# Patient Record
Sex: Male | Born: 1955
Health system: Southern US, Community
[De-identification: ages and names within clinical notes are randomized; demographics above are authoritative.]

## PROBLEM LIST (undated history)

## (undated) DIAGNOSIS — D518 Other vitamin B12 deficiency anemias: Secondary | ICD-10-CM

## (undated) DIAGNOSIS — E559 Vitamin D deficiency, unspecified: Secondary | ICD-10-CM

## (undated) DIAGNOSIS — I77811 Abdominal aortic ectasia: Secondary | ICD-10-CM

## (undated) DIAGNOSIS — E119 Type 2 diabetes mellitus without complications: Secondary | ICD-10-CM

## (undated) DIAGNOSIS — E291 Testicular hypofunction: Secondary | ICD-10-CM

## (undated) DIAGNOSIS — E1165 Type 2 diabetes mellitus with hyperglycemia: Secondary | ICD-10-CM

## (undated) DIAGNOSIS — I739 Peripheral vascular disease, unspecified: Secondary | ICD-10-CM

## (undated) DIAGNOSIS — I6529 Occlusion and stenosis of unspecified carotid artery: Secondary | ICD-10-CM

## (undated) DIAGNOSIS — E039 Hypothyroidism, unspecified: Secondary | ICD-10-CM

## (undated) DIAGNOSIS — F339 Major depressive disorder, recurrent, unspecified: Secondary | ICD-10-CM

## (undated) DIAGNOSIS — J302 Other seasonal allergic rhinitis: Secondary | ICD-10-CM

## (undated) DIAGNOSIS — E785 Hyperlipidemia, unspecified: Secondary | ICD-10-CM

## (undated) DIAGNOSIS — M1990 Unspecified osteoarthritis, unspecified site: Secondary | ICD-10-CM

## (undated) DIAGNOSIS — I6350 Cerebral infarction due to unspecified occlusion or stenosis of unspecified cerebral artery: Secondary | ICD-10-CM

## (undated) DIAGNOSIS — E042 Nontoxic multinodular goiter: Secondary | ICD-10-CM

## (undated) DIAGNOSIS — K5900 Constipation, unspecified: Secondary | ICD-10-CM

## (undated) DIAGNOSIS — I700 Atherosclerosis of aorta: Secondary | ICD-10-CM

## (undated) DIAGNOSIS — K219 Gastro-esophageal reflux disease without esophagitis: Secondary | ICD-10-CM

## (undated) DIAGNOSIS — L819 Disorder of pigmentation, unspecified: Secondary | ICD-10-CM

## (undated) DIAGNOSIS — D649 Anemia, unspecified: Secondary | ICD-10-CM

## (undated) DIAGNOSIS — I10 Essential (primary) hypertension: Secondary | ICD-10-CM

## (undated) DIAGNOSIS — I719 Aortic aneurysm of unspecified site, without rupture: Secondary | ICD-10-CM

## (undated) DIAGNOSIS — Z8719 Personal history of other diseases of the digestive system: Secondary | ICD-10-CM

## (undated) DIAGNOSIS — F32A Depression, unspecified: Secondary | ICD-10-CM

## (undated) DIAGNOSIS — Z8711 Personal history of peptic ulcer disease: Secondary | ICD-10-CM

## (undated) DIAGNOSIS — E790 Hyperuricemia without signs of inflammatory arthritis and tophaceous disease: Secondary | ICD-10-CM

## (undated) HISTORY — DX: Abdominal aortic ectasia: I77.811

## (undated) HISTORY — DX: Aortic aneurysm of unspecified site, without rupture: I71.9

## (undated) HISTORY — DX: Constipation, unspecified: K59.00

## (undated) HISTORY — DX: Hypothyroidism, unspecified: E03.9

## (undated) HISTORY — DX: Essential (primary) hypertension: I10

## (undated) HISTORY — DX: Occlusion and stenosis of unspecified carotid artery: I65.29

## (undated) HISTORY — DX: Other vitamin B12 deficiency anemias: D51.8

## (undated) HISTORY — DX: Hyperlipidemia, unspecified: E78.5

## (undated) HISTORY — DX: Vitamin D deficiency, unspecified: E55.9

## (undated) HISTORY — DX: Nontoxic multinodular goiter: E04.2

## (undated) HISTORY — DX: Peripheral vascular disease, unspecified: I73.9

## (undated) HISTORY — DX: Anemia, unspecified: D64.9

## (undated) HISTORY — DX: Depression, unspecified: F32.A

## (undated) HISTORY — DX: Gastro-esophageal reflux disease without esophagitis: K21.9

## (undated) HISTORY — PX: CATARACT EXTRACTION W/ INTRAOCULAR LENS IMPLANT: SHX1309

## (undated) HISTORY — DX: Cerebral infarction due to unspecified occlusion or stenosis of unspecified cerebral artery: I63.50

## (undated) HISTORY — DX: Type 2 diabetes mellitus with hyperglycemia: E11.65

## (undated) HISTORY — DX: Major depressive disorder, recurrent, unspecified: F33.9

## (undated) HISTORY — DX: Atherosclerosis of aorta: I70.0

## (undated) HISTORY — DX: Testicular hypofunction: E29.1

---

## 1998-12-24 ENCOUNTER — Encounter: Payer: Self-pay | Admitting: Gastroenterology

## 1999-02-09 ENCOUNTER — Encounter: Payer: Self-pay | Admitting: Gastroenterology

## 1999-02-11 ENCOUNTER — Encounter: Payer: Self-pay | Admitting: Gastroenterology

## 1999-02-11 ENCOUNTER — Ambulatory Visit (HOSPITAL_COMMUNITY): Admission: RE | Admit: 1999-02-11 | Discharge: 1999-02-11 | Payer: Self-pay | Admitting: Gastroenterology

## 1999-03-23 ENCOUNTER — Encounter (INDEPENDENT_AMBULATORY_CARE_PROVIDER_SITE_OTHER): Payer: Self-pay | Admitting: *Deleted

## 1999-03-23 ENCOUNTER — Ambulatory Visit (HOSPITAL_COMMUNITY): Admission: RE | Admit: 1999-03-23 | Discharge: 1999-03-23 | Payer: Self-pay | Admitting: Gastroenterology

## 2000-10-18 ENCOUNTER — Encounter: Admission: RE | Admit: 2000-10-18 | Discharge: 2000-10-18 | Payer: Self-pay | Admitting: Orthopedic Surgery

## 2008-02-28 ENCOUNTER — Ambulatory Visit: Payer: Self-pay | Admitting: Internal Medicine

## 2008-02-28 DIAGNOSIS — E7800 Pure hypercholesterolemia, unspecified: Secondary | ICD-10-CM | POA: Insufficient documentation

## 2008-02-28 DIAGNOSIS — I10 Essential (primary) hypertension: Secondary | ICD-10-CM | POA: Insufficient documentation

## 2008-02-28 DIAGNOSIS — J309 Allergic rhinitis, unspecified: Secondary | ICD-10-CM | POA: Insufficient documentation

## 2008-02-28 DIAGNOSIS — J3089 Other allergic rhinitis: Secondary | ICD-10-CM | POA: Insufficient documentation

## 2008-02-28 DIAGNOSIS — E739 Lactose intolerance, unspecified: Secondary | ICD-10-CM | POA: Insufficient documentation

## 2008-02-28 DIAGNOSIS — K219 Gastro-esophageal reflux disease without esophagitis: Secondary | ICD-10-CM | POA: Insufficient documentation

## 2008-02-28 LAB — CONVERTED CEMR LAB
ALT: 25 units/L (ref 0–53)
AST: 36 units/L (ref 0–37)
Albumin: 4.1 g/dL (ref 3.5–5.2)
Alkaline Phosphatase: 60 units/L (ref 39–117)
BUN: 11 mg/dL (ref 6–23)
Basophils Absolute: 0 10*3/uL (ref 0.0–0.1)
Basophils Relative: 0.4 % (ref 0.0–3.0)
Bilirubin, Direct: 0.1 mg/dL (ref 0.0–0.3)
CO2: 31 meq/L (ref 19–32)
Calcium: 10 mg/dL (ref 8.4–10.5)
Chloride: 104 meq/L (ref 96–112)
Cholesterol: 150 mg/dL (ref 0–200)
Creatinine, Ser: 0.9 mg/dL (ref 0.4–1.5)
Eosinophils Absolute: 0.1 10*3/uL (ref 0.0–0.7)
Eosinophils Relative: 2.3 % (ref 0.0–5.0)
GFR calc Af Amer: 114 mL/min
GFR calc non Af Amer: 94 mL/min
Glucose, Bld: 103 mg/dL — ABNORMAL HIGH (ref 70–99)
HCT: 43.7 % (ref 39.0–52.0)
HDL: 48.6 mg/dL (ref >39.0)
Hemoglobin: 14.6 g/dL (ref 13.0–17.0)
Hgb A1c MFr Bld: 5.8 % (ref 4.6–6.0)
Lymphocytes Relative: 28.1 % (ref 12.0–46.0)
MCHC: 33.5 g/dL (ref 30.0–36.0)
MCV: 81.5 fL (ref 78.0–100.0)
Monocytes Absolute: 0.4 10*3/uL (ref 0.1–1.0)
Monocytes Relative: 7.3 % (ref 3.0–12.0)
Neutro Abs: 3.8 10*3/uL (ref 1.4–7.7)
Neutrophils Relative %: 61.9 % (ref 43.0–77.0)
PSA: 0.25 ng/mL (ref 0.10–4.00)
Platelets: 277 10*3/uL (ref 150–400)
Potassium: 4 meq/L (ref 3.5–5.1)
RBC: 5.36 M/uL (ref 4.22–5.81)
RDW: 13.5 % (ref 11.5–14.6)
Sodium: 144 meq/L (ref 135–145)
TSH: 1.52 microintl units/mL (ref 0.35–5.50)
Total Bilirubin: 1 mg/dL (ref 0.3–1.2)
Total Protein: 7.1 g/dL (ref 6.0–8.3)
WBC: 6 10*3/uL (ref 4.5–10.5)

## 2008-05-01 ENCOUNTER — Ambulatory Visit: Payer: Self-pay | Admitting: Gastroenterology

## 2008-05-12 HISTORY — PX: ESOPHAGOGASTRODUODENOSCOPY: SHX1529

## 2008-05-12 HISTORY — PX: COLONOSCOPY: SHX174

## 2008-05-13 ENCOUNTER — Ambulatory Visit: Payer: Self-pay | Admitting: Gastroenterology

## 2008-05-13 LAB — CONVERTED CEMR LAB: UREASE: NEGATIVE

## 2008-05-15 ENCOUNTER — Encounter: Payer: Self-pay | Admitting: Gastroenterology

## 2008-07-01 ENCOUNTER — Ambulatory Visit: Payer: Self-pay | Admitting: Internal Medicine

## 2009-10-29 ENCOUNTER — Telehealth: Payer: Self-pay | Admitting: Gastroenterology

## 2009-11-05 ENCOUNTER — Ambulatory Visit: Payer: Self-pay | Admitting: Gastroenterology

## 2009-11-05 DIAGNOSIS — R142 Eructation: Secondary | ICD-10-CM

## 2009-11-05 DIAGNOSIS — R143 Flatulence: Secondary | ICD-10-CM

## 2009-11-05 DIAGNOSIS — R197 Diarrhea, unspecified: Secondary | ICD-10-CM | POA: Insufficient documentation

## 2009-11-05 DIAGNOSIS — R141 Gas pain: Secondary | ICD-10-CM | POA: Insufficient documentation

## 2009-11-06 LAB — CONVERTED CEMR LAB
IgA: 226 mg/dL (ref 68–378)
TSH: 1.15 microintl units/mL (ref 0.35–5.50)
Tissue Transglutaminase Ab, IgA: 15.6 units (ref <20)

## 2009-11-10 ENCOUNTER — Telehealth: Payer: Self-pay | Admitting: Gastroenterology

## 2009-11-18 ENCOUNTER — Ambulatory Visit (HOSPITAL_COMMUNITY): Admission: RE | Admit: 2009-11-18 | Discharge: 2009-11-18 | Payer: Self-pay | Admitting: Gastroenterology

## 2009-12-04 ENCOUNTER — Telehealth: Payer: Self-pay | Admitting: Gastroenterology

## 2010-05-01 ENCOUNTER — Encounter: Payer: Self-pay | Admitting: Orthopedic Surgery

## 2010-05-02 ENCOUNTER — Encounter: Payer: Self-pay | Admitting: Gastroenterology

## 2010-05-02 ENCOUNTER — Encounter: Payer: Self-pay | Admitting: Unknown Physician Specialty

## 2010-05-11 NOTE — Progress Notes (Signed)
Summary: Triage  Phone Note Call from Patient Call back at Home Phone [phone]   Caller: Patient Call For: Dr. Russella Dar Reason for Call: Talk to Nurse Summary of Call: having abd. pain, bloating and gas Initial call taken by: Karna Christmas,  October 29, 2009 3:44 PM  Follow-up for Phone Call        patient scheduled to see Dr Russella Dar 7/28/111, continued GI problems and requests an appointment with Dr Russella Dar. Follow-up by: Darcey Nora RN, CGRN,  October 29, 2009 3:56 PM

## 2010-05-11 NOTE — Progress Notes (Signed)
Summary: resch   Phone Note Call from Patient Call back at Work Phone [phone]   Caller: Patient Call For: Dr. Russella Dar Reason for Call: Talk to Nurse Summary of Call: would like to resch [place] Initial call taken by: Vallarie Mare,  November 10, 2009 12:58 PM  Follow-up for Phone Call        patient is provided the number to [facility] radiology to reschedule his appointment  Follow-up by: Darcey Nora RN, CGRN,  November 10, 2009 1:49 PM

## 2010-05-11 NOTE — Progress Notes (Signed)
Summary: Triage  Phone Note Call from Patient Call back at Work Phone [phone]   Caller: Patient Call For: Dr Russella Dar Reason for Call: Talk to Nurse Details for Reason: Triage Summary of Call: Pt c/o continued bloating. States he is "uncomfortable" Initial call taken by: Dwan Bolt,  December 04, 2009 12:14 PM  Follow-up for Phone Call        Patient  says the xifaxan he was treated didn't help his gas and bloating. Patient  states he hasn't changed his diet or tried gas-x as recommended by Dr Russella Dar in the office visit 11/05/09.  Reviewed that may be dietary and will mail him a gas prevention diet.  He is also asked to try gas-x three times a day ac meals.  he is asked to call back if he makes dietary changes and sees no improvement. Follow-up by: Darcey Nora RN, CGRN,  December 04, 2009 1:45 PM

## 2010-05-11 NOTE — Assessment & Plan Note (Signed)
Summary: gas/bloating/abdominal pain/sheri   History of Present Illness Visit Type: Follow-up Visit Primary GI MD: Elie Goody MD [facility] Primary Provider: Gaylyn Rong, MD Requesting Provider: Marzella Schlein Chief Complaint: gas and bloating after eating with loose stools few hours after x 3-4 months History of Present Illness:   Mr. Tagliaferro complains of 3-4 months of gas, bloating, fluctuating abdominal distention and loose stools. His symptoms predominantly occur after meals. He underwent upper endoscopy and colonoscopy in February 2010. Colonoscopy was normal. He notes no changes in diet, and no recent antibiotic use. He traveled to [place] several months ago.   GI Review of Systems    Reports belching and  bloating.      Denies abdominal pain, acid reflux, chest pain, dysphagia with liquids, dysphagia with solids, heartburn, loss of appetite, nausea, vomiting, vomiting blood, and  weight loss.      Reports change in bowel habits and  diarrhea.     Denies anal fissure, black tarry stools, constipation, diverticulosis, fecal incontinence, heme positive stool, hemorrhoids, irritable bowel syndrome, jaundice, light color stool, liver problems, rectal bleeding, and  rectal pain.   Current Medications (verified): 1)  Metoprolol Succinate 50 Mg Xr24h-Tab (Metoprolol Succinate) .Marland Kitchen.. 1 Once Daily 2)  Nexium 40 Mg Cpdr (Esomeprazole Magnesium) .Marland Kitchen.. 1 Once Daily 3)  Simvastatin 20 Mg Tabs (Simvastatin) .Marland Kitchen.. 1 Once Daily 4)  Advair Diskus 500-50 Mcg/dose Aepb (Fluticasone-Salmeterol) .... Use As Directed As Needed  Allergies (verified): No Known Drug Allergies  Past History:  Past Medical History: Reviewed history from 05/01/2008 and no changes required. Allergic rhinitis GERD Gastric ulcer, 2004 in [place] Hyperlipidemia Hypertension impaired glucose tolerance  Past Surgical History:  esophagogastroduodenoscopy February 2010 colonoscopy February 2010  Family  History: Reviewed history from 05/01/2008 and no changes required. father died age 55, MI mother age 83.  History of hypertension, cholelithiasis    One brother two sisters in good health No FH of Colon Cancer:  Social History: Reviewed history from 05/01/2008 and no changes required. Married two children nonsmoker-former smoker born in [place]; resident of [place] 27 years descries moderately heavy alcohol use, but has tapered Daily Caffeine Use-10 cups daily Patient gets regular exercise.  Review of Systems       The patient complains of allergy/sinus.         The pertinent positives and negatives are noted as above and in the HPI. All other ROS were reviewed and were negative.   Vital Signs:  Patient profile:   55 year old male Height:      66 inches Weight:      160 pounds BMI:     25.92 Pulse rate:   100 / minute Pulse rhythm:   regular BP sitting:   118 / 90  (left arm)  Vitals Entered By: Milford Cage NCMA (November 05, 2009 10:26 AM)  Physical Exam  General:  Well developed, well nourished, no acute distress. Head:  Normocephalic and atraumatic. Eyes:  PERRLA, no icterus. Mouth:  No deformity or lesions, dentition normal. Lungs:  Clear throughout to auscultation. Heart:  Regular rate and rhythm; no murmurs, rubs,  or bruits. Abdomen:  Soft, nontender and nondistended. No masses, hepatosplenomegaly or hernias noted. Normal bowel sounds. Neurologic:  Alert and  oriented x4;  grossly normal neurologically. Psych:  Alert and cooperative. anxious.    Impression & Recommendations:  Problem # 1:  ABDOMINAL DISTENSION (ICD-787.3) This is likely secondary to gas and bloating. Rule out mass lesions, and ascites, although much less  likely than gas and bloating.  Orders: Ultrasound Abdomen (UAS)  Problem # 2:  DIARRHEA (ICD-787.91) Symptoms typical for irritable bowel syndrome. Rule out chronic intestinal infections. Trial of Xifaxan as below. Consider a trial of  metronidazole studies negative and the symptoms do not respond to Xifaxan. Orders: TLB-TSH (Thyroid Stimulating Hormone) (84443-TSH) TLB-IgA (Immunoglobulin A) (82784-IGA) T-Sprue Panel (Celiac Disease Aby Eval) (83516x3/[address]) T-Culture, Stool (87045/[id number]) T-Culture, C-Diff Toxin A/B (91478-29562) T-Stool for Fat, Quantitative [id number]) T-Stool Fats [place] Stain [id number]) T-Stool Giardia / Crypto- EIA (24401) T-Stool for O&P (02725-36644) T-Fecal WBC (03474-25956)  Problem # 3:  FLATULENCE ERUCTATION AND GAS PAIN (ICD-787.3) Low gas diet. Trial of Xifaxan 500 mg t.i.d. for 10 days.  Patient Instructions: 1)  Get your labs drawn today in the basement.  2)  You have been scheduled for a abdominal ultrasound.  3)  Excessive Gas Diet handout given.  4)  Use Gas-X three times a day as needed. 5)  Pick up your prescription from your pharmacy.  6)  Please schedule a follow-up appointment in 6  weeks.  7)  Copy sent to : Marzella Schlein 8)  The medication list was reviewed and reconciled.  All changed / newly prescribed medications were explained.  A complete medication list was provided to the patient / caregiver.  Prescriptions: XIFAXAN 200 MG TABS (RIFAXIMIN) 2  tablet by mouth three times a day  #60 x 0   Entered by:   Christie Nottingham CMA (AAMA)   Authorized by:   Meryl Dare MD [facility]   Signed by:   Meryl Dare MD [facility] on 11/05/2009   Method used:   Electronically to        [facility] [address] (retail)       [phone]       [place], [place]  [address]       Ph: [phone]       Fax: [phone]   RxID:   [id number]

## 2010-08-27 NOTE — Procedures (Signed)
[facility]  Patient:    Justin [name]                            MRN: [id number] Proc. Date: 03/23/99 Adm. Date:  [id number] Attending:  Charna Elizabeth CC:         Anna Genre. Little, M.D.                           Procedure Report  DATE OF BIRTH:  June 10, 1935  REFERRING PHYSICIAN:  Anna Genre. Little, M.D.  PROCEDURES PERFORMED: Colonoscopy with biopsy times one.  ENDOSCOPIST:  Anselmo Rod, M.D.  INSTRUMENT USED: Olympus video colonoscope.  INDICATION FOR PROCEDURE:  Guaiac positive stool in a 55 year old [place] male, ule out polyps, AVMs, masses, hemorrhoids, etc.  PREPROCEDURE PREPARATION:  Informed consent was procured from the patient.  The  patient was fasted for eight hours prior to the procedure and prepped with a bottle of magnesium citrate and a gallon of NuLytely the night prior to the procedure.  PREPROCEDURE PHYSICAL: The patient had stable vital signs.  NECK:  Supple.  CHEST:  Clear to auscultation.  S1 and S2 regular.  ABDOMEN:  Soft with normal abdominal bowel sounds.  DESCRIPTION OF PROCEDURE:  The patient was placed in the left lateral decubitus  position and sedated with 50 mg of Demerol and 4 mg of Versed intravenously. Once the patient was adequately sedated and maintained on low flow oxygen and continuous cardiac monitoring, the Olympus video colonoscope was advanced from the rectum o the cecum without difficulty.  The procedure was completed up to the cecum. The patient had one small diminutive polyp at 15 cm that was biopsied by regular biopsy forceps.  No other abnormalities were seen.  The patient tolerated the procedure well without complications.  IMPRESSION:  One small polyp removed by regular biopsy forceps at 15 cm. Otherwise normal colonoscopy.  RECOMMENDATIONS: The patient has been advised to follow up in the office for repeat guaiac testing.  Further recommendations will be made  thereafter. DD:  03/23/99 TD:  03/24/99 Job: [id number]

## 2010-09-20 ENCOUNTER — Encounter: Payer: Self-pay | Admitting: Internal Medicine

## 2010-09-24 ENCOUNTER — Ambulatory Visit: Payer: Self-pay | Admitting: Internal Medicine

## 2010-09-26 NOTE — Progress Notes (Signed)
  Subjective:    Patient ID: Justin [name], male    DOB: 16-Apr-1955, 55 y.o.   MRN: [id number]  HPI No show   Review of Systems     Objective:   Physical Exam        Assessment & Plan:

## 2010-10-05 ENCOUNTER — Other Ambulatory Visit: Payer: Self-pay | Admitting: Allergy

## 2010-10-05 ENCOUNTER — Ambulatory Visit
Admission: RE | Admit: 2010-10-05 | Discharge: 2010-10-05 | Disposition: A | Discharge status: Home / Self Care | Payer: BC Managed Care – PPO | Source: Ambulatory Visit | Attending: Allergy | Admitting: Allergy

## 2010-10-05 DIAGNOSIS — J329 Chronic sinusitis, unspecified: Secondary | ICD-10-CM

## 2010-10-05 DIAGNOSIS — J45901 Unspecified asthma with (acute) exacerbation: Secondary | ICD-10-CM

## 2010-10-08 ENCOUNTER — Other Ambulatory Visit: Payer: BC Managed Care – PPO

## 2011-06-21 ENCOUNTER — Other Ambulatory Visit (HOSPITAL_COMMUNITY): Payer: Self-pay | Admitting: Cardiology

## 2011-06-21 DIAGNOSIS — I771 Stricture of artery: Secondary | ICD-10-CM

## 2011-06-27 ENCOUNTER — Encounter (HOSPITAL_COMMUNITY)
Admission: RE | Admit: 2011-06-27 | Discharge: 2011-06-27 | Disposition: A | Discharge status: Home / Self Care | Payer: BC Managed Care – PPO | Source: Ambulatory Visit | Attending: Cardiology | Admitting: Cardiology

## 2011-06-27 DIAGNOSIS — R079 Chest pain, unspecified: Secondary | ICD-10-CM | POA: Insufficient documentation

## 2011-06-27 DIAGNOSIS — K219 Gastro-esophageal reflux disease without esophagitis: Secondary | ICD-10-CM | POA: Insufficient documentation

## 2011-06-27 DIAGNOSIS — I10 Essential (primary) hypertension: Secondary | ICD-10-CM | POA: Insufficient documentation

## 2011-06-27 DIAGNOSIS — R0602 Shortness of breath: Secondary | ICD-10-CM | POA: Insufficient documentation

## 2011-06-27 MED ORDER — REGADENOSON 0.4 MG/5ML IV SOLN
INTRAVENOUS | Status: AC
  Filled 2011-06-27: qty 5

## 2011-06-27 MED ORDER — REGADENOSON 0.4 MG/5ML IV SOLN
0.4000 mg | Freq: Once | INTRAVENOUS | Status: AC
  Administered 2011-06-27: 0.4 mg via INTRAVENOUS

## 2011-06-27 MED ORDER — TECHNETIUM TC 99M TETROFOSMIN IV KIT
10.0000 | PACK | Freq: Once | INTRAVENOUS | Status: AC | PRN
Start: 2011-06-27 — End: 2011-06-27
  Administered 2011-06-27: 10 via INTRAVENOUS

## 2011-06-27 MED ORDER — TECHNETIUM TC 99M TETROFOSMIN IV KIT
30.0000 | PACK | Freq: Once | INTRAVENOUS | Status: AC | PRN
  Administered 2011-06-27: 30 via INTRAVENOUS

## 2011-06-28 ENCOUNTER — Ambulatory Visit (HOSPITAL_COMMUNITY)
Admission: RE | Admit: 2011-06-28 | Discharge: 2011-06-28 | Disposition: A | Discharge status: Home / Self Care | Payer: BC Managed Care – PPO | Source: Ambulatory Visit | Attending: Cardiology | Admitting: Cardiology

## 2011-06-28 DIAGNOSIS — I771 Stricture of artery: Secondary | ICD-10-CM

## 2011-06-29 ENCOUNTER — Telehealth (HOSPITAL_COMMUNITY): Payer: Self-pay

## 2011-06-29 ENCOUNTER — Other Ambulatory Visit (HOSPITAL_COMMUNITY): Payer: Self-pay | Admitting: Interventional Radiology

## 2011-06-29 DIAGNOSIS — I771 Stricture of artery: Secondary | ICD-10-CM

## 2011-06-29 NOTE — Telephone Encounter (Signed)
Tried to contact pt to find out if he was going to proceed.  I was unable to get in touch with him.. He had stated he would call first thing this morning.

## 2011-07-01 ENCOUNTER — Other Ambulatory Visit: Payer: Self-pay | Admitting: Physician Assistant

## 2011-07-04 ENCOUNTER — Other Ambulatory Visit (HOSPITAL_COMMUNITY): Payer: Self-pay | Admitting: Physician Assistant

## 2011-07-04 ENCOUNTER — Encounter (HOSPITAL_COMMUNITY): Payer: Self-pay | Admitting: Pharmacy Technician

## 2011-07-05 ENCOUNTER — Other Ambulatory Visit: Payer: Self-pay | Admitting: Radiology

## 2011-07-05 ENCOUNTER — Ambulatory Visit (HOSPITAL_COMMUNITY)
Admission: RE | Admit: 2011-07-05 | Discharge: 2011-07-05 | Disposition: A | Discharge status: Home / Self Care | Payer: BC Managed Care – PPO | Source: Ambulatory Visit | Attending: Interventional Radiology | Admitting: Interventional Radiology

## 2011-07-05 ENCOUNTER — Encounter (HOSPITAL_COMMUNITY): Payer: Self-pay

## 2011-07-05 ENCOUNTER — Other Ambulatory Visit (HOSPITAL_COMMUNITY): Payer: Self-pay | Admitting: Interventional Radiology

## 2011-07-05 DIAGNOSIS — K219 Gastro-esophageal reflux disease without esophagitis: Secondary | ICD-10-CM | POA: Insufficient documentation

## 2011-07-05 DIAGNOSIS — E785 Hyperlipidemia, unspecified: Secondary | ICD-10-CM | POA: Insufficient documentation

## 2011-07-05 DIAGNOSIS — I771 Stricture of artery: Secondary | ICD-10-CM

## 2011-07-05 DIAGNOSIS — I6529 Occlusion and stenosis of unspecified carotid artery: Secondary | ICD-10-CM | POA: Insufficient documentation

## 2011-07-05 DIAGNOSIS — I10 Essential (primary) hypertension: Secondary | ICD-10-CM | POA: Insufficient documentation

## 2011-07-05 LAB — BASIC METABOLIC PANEL
BUN: 12 mg/dL (ref 6–23)
CO2: 24 mEq/L (ref 19–32)
Chloride: 106 mEq/L (ref 96–112)
Creatinine, Ser: 0.83 mg/dL (ref 0.50–1.35)
Glucose, Bld: 119 mg/dL — ABNORMAL HIGH (ref 70–99)

## 2011-07-05 LAB — CBC
HCT: 47.7 % (ref 39.0–52.0)
Hemoglobin: 16.2 g/dL (ref 13.0–17.0)
MCH: 27.3 pg (ref 26.0–34.0)
MCHC: 34 g/dL (ref 30.0–36.0)
MCV: 80.3 fL (ref 78.0–100.0)

## 2011-07-05 LAB — PROTIME-INR: INR: 1.07 (ref 0.00–1.49)

## 2011-07-05 MED ORDER — HEPARIN SOD (PORK) LOCK FLUSH 100 UNIT/ML IV SOLN
500.0000 [IU] | Freq: Once | INTRAVENOUS | Status: AC
  Administered 2011-07-05: 500 [IU] via INTRAVENOUS
  Filled 2011-07-05: qty 5

## 2011-07-05 MED ORDER — IOHEXOL 300 MG/ML  SOLN: 90.0000 mL | Freq: Once | INTRAMUSCULAR | Status: AC | PRN

## 2011-07-05 MED ORDER — FENTANYL CITRATE 0.05 MG/ML IJ SOLN
INTRAMUSCULAR | Status: AC | PRN
  Administered 2011-07-05 (×2): 25 ug via INTRAVENOUS

## 2011-07-05 MED ORDER — HEPARIN SOD (PORK) LOCK FLUSH 100 UNIT/ML IV SOLN
500.0000 [IU] | Freq: Once | INTRAVENOUS | Status: AC
  Administered 2011-07-05: 500 [IU] via INTRAVENOUS

## 2011-07-05 MED ORDER — MIDAZOLAM HCL 2 MG/2ML IJ SOLN
INTRAMUSCULAR | Status: AC
  Filled 2011-07-05: qty 6

## 2011-07-05 MED ORDER — SODIUM CHLORIDE 0.9 % IV SOLN
INTRAVENOUS | Status: DC
  Administered 2011-07-05: 50 mL/h via INTRAVENOUS

## 2011-07-05 MED ORDER — SODIUM CHLORIDE 0.9 % IV SOLN: INTRAVENOUS | Status: AC

## 2011-07-05 MED ORDER — FENTANYL CITRATE 0.05 MG/ML IJ SOLN
INTRAMUSCULAR | Status: AC
  Filled 2011-07-05: qty 4

## 2011-07-05 MED ORDER — MIDAZOLAM HCL 5 MG/5ML IJ SOLN
INTRAMUSCULAR | Status: AC | PRN
  Administered 2011-07-05: 1 mg via INTRAVENOUS
  Administered 2011-07-05 (×2): 0.5 mg via INTRAVENOUS

## 2011-07-05 NOTE — H&P (Signed)
Justin [name] is an 56 y.o. male.   Chief Complaint: Right carotid bruit US shows stenosis; asymptomatic HPI: scheduled for cerebral arteriogram in IR  Past Medical History  Diagnosis Date  . Allergic rhinitis   . GERD (gastroesophageal reflux disease)   . Gastric ulcer   . Hyperlipidemia   . HTN (hypertension)     Past Surgical History  Procedure Date  . Esophagogastroduodenoscopy 05-2008  . Colonoscopy 05-2008    Family History  Problem Relation Age of Onset  . Heart attack Father 49  . Hypertension Mother    Social History:  reports that he has quit smoking. He does not have any smokeless tobacco history on file. He reports that he drinks alcohol. His drug history not on file.  Allergies: No Known Allergies  Medications Prior to Admission  Medication Sig Dispense Refill  . cholecalciferol (VITAMIN D) 1000 UNITS tablet Take 1,000 Units by mouth daily.      Marland Kitchen esomeprazole (NEXIUM) 40 MG capsule Take 40 mg by mouth daily before breakfast.        . Fluticasone-Salmeterol (ADVAIR DISKUS) 500-50 MCG/DOSE AEPB Inhale 1 puff into the lungs every 12 (twelve) hours as needed. For shortness of breath      . metoprolol (TOPROL-XL) 50 MG 24 hr tablet Take 100 mg by mouth daily.       . simvastatin (ZOCOR) 20 MG tablet Take 40 mg by mouth at bedtime.       . vitamin B-12 (CYANOCOBALAMIN) 1000 MCG tablet Take 1,000 mcg by mouth daily.       Medications Prior to Admission  Medication Dose Route Frequency Provider Last Rate Last Dose  . 0.9 %  sodium chloride infusion   Intravenous Continuous Abundio Miu, MD        No results found for this or any previous visit (from the past 48 hour(s)). No results found.  Review of Systems  Constitutional: Negative for fever.  Gastrointestinal: Negative for nausea and vomiting.    Blood pressure 161/104, pulse 88, temperature 98.6 F (37 C), temperature source Oral, resp. rate 18, height 5\' 7"  (1.702 m), weight 150 lb (68.04 kg), SpO2  97.00%. Physical Exam  Constitutional: He is oriented to person, place, and time. He appears well-developed and well-nourished.  HENT:  Head: Normocephalic.  Eyes: EOM are normal.  Neck: Normal range of motion.  Cardiovascular: Normal rate, regular rhythm and normal heart sounds.   No murmur heard. Respiratory: Effort normal and breath sounds normal. He has no wheezes.  GI: Soft. Bowel sounds are normal. There is no tenderness.  Musculoskeletal: Normal range of motion.  Neurological: He is alert and oriented to person, place, and time.  Skin: Skin is warm and dry.     Assessment/Plan Rt carotid bruit; stenosis on [place] Scheduled for cerebral arteriogram in IR Pt and family aware of procedure benefits and risks and agreeable to proceed. Consent signed.  [name] A 07/05/2011, 8:42 AM

## 2011-07-05 NOTE — Procedures (Signed)
S/P 4 Vessel cerebral aretriogram. RT CFA approach  Findings  1. Approx !0 -20% stenosis of RT ICA proximally

## 2011-07-05 NOTE — Discharge Instructions (Signed)

## 2011-07-06 ENCOUNTER — Telehealth (HOSPITAL_COMMUNITY): Payer: Self-pay

## 2011-11-04 ENCOUNTER — Other Ambulatory Visit: Payer: Self-pay | Admitting: Otolaryngology

## 2011-11-04 ENCOUNTER — Ambulatory Visit
Admission: RE | Admit: 2011-11-04 | Discharge: 2011-11-04 | Disposition: A | Discharge status: Home / Self Care | Payer: BC Managed Care – PPO | Source: Ambulatory Visit | Attending: Otolaryngology | Admitting: Otolaryngology

## 2011-11-04 DIAGNOSIS — J3800 Paralysis of vocal cords and larynx, unspecified: Secondary | ICD-10-CM

## 2012-02-14 ENCOUNTER — Ambulatory Visit (HOSPITAL_BASED_OUTPATIENT_CLINIC_OR_DEPARTMENT_OTHER): Payer: BC Managed Care – PPO | Attending: Neurology

## 2012-02-14 VITALS — Ht 67.0 in | Wt 160.0 lb

## 2012-02-14 DIAGNOSIS — G4733 Obstructive sleep apnea (adult) (pediatric): Secondary | ICD-10-CM | POA: Insufficient documentation

## 2012-02-24 DIAGNOSIS — G4733 Obstructive sleep apnea (adult) (pediatric): Secondary | ICD-10-CM

## 2012-02-25 NOTE — Procedures (Signed)
NAMEBRILYN, Justin [name]                  ACCOUNT NO.:  [id number]  MEDICAL RECORD NO.:  [id number]          PATIENT TYPE:  OUT  LOCATION:  SLEEP CENTER                 FACILITY:  [facility]  PHYSICIAN:  Clinton D. Maple Hudson, MD, FCCP, FACPDATE OF BIRTH:  March 28, 1956  DATE OF STUDY:  02/14/2012                           NOCTURNAL POLYSOMNOGRAM  REFERRING PHYSICIAN:  MICHAEL DALE APPLEGATE  REFERRING PHYSICIAN:  Neysa Bonito, M.D.  INDICATION FOR STUDY:  Hypersomnia with sleep apnea.  EPWORTH SLEEPINESS SCORE:  17/24.  BMI 25.1, weight 160 pounds.  Height 67 inches, neck 17.5 inches.  MEDICATIONS:  Home medications are charted and reviewed.  SLEEP ARCHITECTURE:  Total sleep time 288.5 minutes with sleep efficiency 80.1%.  Stage I was 14.9%, stage II 81.6%, stage III absent, REM 3.5% of total sleep time.  Sleep latency 4 minutes, REM latency 192.5 minutes, awake after sleep onset 67.5 minutes.  Arousal index 70.1.  BEDTIME MEDICATION:  None.  RESPIRATORY DATA:  Apnea-hypopnea index (AHI) 77.4 per hour.  A total of 370 events was scored including 355 obstructive apneas, 2 mixed apneas, 13 hypopneas.  Events were not positional.  REM AHI 72 per hour.  This study was done as ordered, as a diagnostic NPSG protocol.  CPAP titration was not done.  OXYGEN DATA:  Loud snoring with oxygen desaturation to a nadir of 78% and mean oxygen saturation through the study of 94% on room air.  CARDIAC DATA:  Normal sinus rhythm.  MOVEMENT-PARASOMNIA:  No significant movement disturbance.  Bathroom x3.  IMPRESSIONS-RECOMMENDATIONS: 1. Severe obstructive sleep apnea/hypopnea syndrome, AHI 77.4 per hour     with non-positional events.  Loud snoring with oxygen desaturation     to a nadir of 78% and mean oxygen saturation through the study of     94% on room air. 2. This study was done as a diagnostic NPSG protocol as ordered.     Consider return for dedicated CPAP titration study or evaluate for  alternative management as clinically appropriate.     Clinton D. Maple Hudson, MD, Tonny Bollman, FACP Diplomate, American Board of Sleep Medicine    CDY/MEDQ  D:  02/25/2012 09:58:29  T:  02/25/2012 21:31:05  Job:  [id number]

## 2013-06-24 ENCOUNTER — Encounter (HOSPITAL_COMMUNITY): Payer: Self-pay | Admitting: Pharmacy Technician

## 2013-06-27 ENCOUNTER — Other Ambulatory Visit (HOSPITAL_COMMUNITY): Payer: Self-pay | Admitting: *Deleted

## 2013-06-27 ENCOUNTER — Ambulatory Visit (HOSPITAL_COMMUNITY)
Admission: RE | Admit: 2013-06-27 | Discharge: 2013-06-27 | Disposition: A | Discharge status: Home / Self Care | Payer: BC Managed Care – PPO | Source: Ambulatory Visit | Attending: Orthopedic Surgery | Admitting: Orthopedic Surgery

## 2013-06-27 ENCOUNTER — Encounter (HOSPITAL_COMMUNITY): Payer: Self-pay

## 2013-06-27 ENCOUNTER — Encounter (HOSPITAL_COMMUNITY)
Admission: RE | Admit: 2013-06-27 | Discharge: 2013-06-27 | Disposition: A | Discharge status: Home / Self Care | Payer: BC Managed Care – PPO | Source: Ambulatory Visit | Attending: Orthopedic Surgery | Admitting: Orthopedic Surgery

## 2013-06-27 DIAGNOSIS — E119 Type 2 diabetes mellitus without complications: Secondary | ICD-10-CM | POA: Insufficient documentation

## 2013-06-27 DIAGNOSIS — Z01812 Encounter for preprocedural laboratory examination: Secondary | ICD-10-CM | POA: Insufficient documentation

## 2013-06-27 DIAGNOSIS — Z01810 Encounter for preprocedural cardiovascular examination: Secondary | ICD-10-CM | POA: Insufficient documentation

## 2013-06-27 HISTORY — DX: Other seasonal allergic rhinitis: J30.2

## 2013-06-27 HISTORY — DX: Hyperuricemia without signs of inflammatory arthritis and tophaceous disease: E79.0

## 2013-06-27 HISTORY — DX: Unspecified osteoarthritis, unspecified site: M19.90

## 2013-06-27 HISTORY — DX: Type 2 diabetes mellitus without complications: E11.9

## 2013-06-27 HISTORY — DX: Disorder of pigmentation, unspecified: L81.9

## 2013-06-27 HISTORY — DX: Personal history of peptic ulcer disease: Z87.11

## 2013-06-27 HISTORY — DX: Personal history of other diseases of the digestive system: Z87.19

## 2013-06-27 LAB — BASIC METABOLIC PANEL
BUN: 8 mg/dL (ref 6–23)
CALCIUM: 9.8 mg/dL (ref 8.4–10.5)
CHLORIDE: 100 meq/L (ref 96–112)
CO2: 29 meq/L (ref 19–32)
Creatinine, Ser: 0.74 mg/dL (ref 0.50–1.35)
GFR calc Af Amer: >90 mL/min (ref >90)
GFR calc non Af Amer: >90 mL/min (ref >90)
Glucose, Bld: 151 mg/dL — ABNORMAL HIGH (ref 70–99)
Potassium: 3.8 mEq/L (ref 3.7–5.3)
Sodium: 140 mEq/L (ref 137–147)

## 2013-06-27 LAB — SURGICAL PCR SCREEN
MRSA, PCR: NEGATIVE
Staphylococcus aureus: NEGATIVE

## 2013-06-27 LAB — CBC
HCT: 44.6 % (ref 39.0–52.0)
Hemoglobin: 15 g/dL (ref 13.0–17.0)
MCH: 26.5 pg (ref 26.0–34.0)
MCHC: 33.6 g/dL (ref 30.0–36.0)
MCV: 78.8 fL (ref 78.0–100.0)
PLATELETS: 226 10*3/uL (ref 150–400)
RBC: 5.66 MIL/uL (ref 4.22–5.81)
RDW: 13.8 % (ref 11.5–15.5)
WBC: 5.6 10*3/uL (ref 4.0–10.5)

## 2013-06-27 LAB — URINALYSIS, ROUTINE W REFLEX MICROSCOPIC
BILIRUBIN URINE: NEGATIVE
GLUCOSE, UA: NEGATIVE mg/dL
Hgb urine dipstick: NEGATIVE
KETONES UR: NEGATIVE mg/dL
Leukocytes, UA: NEGATIVE
Nitrite: NEGATIVE
PH: 6.5 (ref 5.0–8.0)
PROTEIN: NEGATIVE mg/dL
Specific Gravity, Urine: 1.011 (ref 1.005–1.030)
Urobilinogen, UA: 0.2 mg/dL (ref 0.0–1.0)

## 2013-06-27 LAB — PROTIME-INR
INR: 1.02 (ref 0.00–1.49)
Prothrombin Time: 13.2 seconds (ref 11.6–15.2)

## 2013-06-27 LAB — ABO/RH: ABO/RH(D): B POS

## 2013-06-27 LAB — APTT: aPTT: 32 seconds (ref 24–37)

## 2013-06-27 NOTE — Patient Instructions (Addendum)
Justin [name]  06/27/2013                           YOUR PROCEDURE IS SCHEDULED ON: 07/02/13               PLEASE REPORT TO SHORT STAY CENTER AT : 5:00 AM               CALL THIS NUMBER IF ANY PROBLEMS THE DAY OF SURGERY :               [phone]                                REMEMBER:   Do not eat food or drink liquids AFTER MIDNIGHT                  Take these medicines the morning of surgery with A SIP OF WATER: METOPROLOL / NEXIUM   Do not wear jewelry, make-up   Do not wear lotions, powders, or perfumes.   Do not shave legs or underarms 12 hrs. before surgery (men may shave face)  Do not bring valuables to the hospital.  Contacts, dentures or bridgework may not be worn into surgery.  Leave suitcase in the car. After surgery it may be brought to your room.  For patients admitted to the hospital more than one night, checkout time is            11:00 AM                                                       The day of discharge.   Patients discharged the day of surgery will not be allowed to drive home.            If going home same day of surgery, must have someone stay with you              FIRST 24 hrs at home and arrange for some one to drive you              home from hospital.    Special Instructions             Please read over the following fact sheets that you were given:               1. [name] PREPARING FOR SURGERY SHEET               2. INCENTIVE SPIROMETER               3. MRSA INFORMATION SHEET                                                X_____________________________________________________________________        Failure to follow these instructions may result in cancellation of your surgery

## 2013-06-30 NOTE — H&P (Signed)
TOTAL HIP ADMISSION H&P  Patient is admitted for right total hip arthroplasty, anterior approach.  Subjective:  Chief Complaint: Right hip OA / pain  HPI: Justin [name], 58 y.o. male, has a history of pain and functional disability in the right hip(s) due to arthritis and patient has failed non-surgical conservative treatments for greater than 12 weeks to include NSAID's and/or analgesics, corticosteriod injections and activity modification.  Onset of symptoms was gradual starting >10 years ago with gradually worsening course since that time.The patient noted no past surgery on the right hip(s).  Patient currently rates pain in the right hip at 8 out of 10 with activity. Patient has worsening of pain with activity and weight bearing, trendelenberg gait, pain that interfers with activities of daily living and pain with passive range of motion. Patient has evidence of periarticular osteophytes and joint space narrowing by imaging studies. This condition presents safety issues increasing the risk of falls.  There is no current active infection.   Risks, benefits and expectations were discussed with the patient.  Risks including but not limited to the risk of anesthesia, blood clots, nerve damage, blood vessel damage, failure of the prosthesis, infection and up to and including death.  Patient understand the risks, benefits and expectations and wishes to proceed with surgery.   D/C Plans:     Home with HHPT  Post-op Meds:     No Rx given  Tranexamic Acid:   To be given  Decadron:       Not to be given - DM  FYI:    ASA post-op  Norco post-op   Patient Active Problem List   Diagnosis Date Noted  . Flatulence, eructation, and gas pain 11/05/2009  . DIARRHEA 11/05/2009  . IMPAIRED GLUCOSE TOLERANCE 02/28/2008  . HYPERLIPIDEMIA 02/28/2008  . HYPERTENSION 02/28/2008  . ALLERGIC RHINITIS 02/28/2008  . GERD 02/28/2008   Past Medical History  Diagnosis Date  . Allergic rhinitis   . GERD  (gastroesophageal reflux disease)   . Hyperlipidemia   . HTN (hypertension)   . Seasonal allergies   . Arthritis   . Elevated uric acid in blood   . Discoloration of skin     L LEG  . History of stomach ulcers   . Diabetes mellitus without complication     Past Surgical History  Procedure Laterality Date  . Esophagogastroduodenoscopy  05-2008  . Colonoscopy  05-2008    No prescriptions prior to admission   No Known Allergies   History  Substance Use Topics  . Smoking status: Never Smoker   . Smokeless tobacco: Never Used  . Alcohol Use: Yes     Comment: RARE    Family History  Problem Relation Age of Onset  . Heart attack Father 7  . Hypertension Mother      Review of Systems  Constitutional: Negative.   HENT: Negative.   Eyes: Negative.   Respiratory: Negative.   Cardiovascular: Negative.   Gastrointestinal: Positive for heartburn and diarrhea.  Genitourinary: Negative.   Musculoskeletal: Positive for joint pain.  Skin: Negative.   Neurological: Negative.   Endo/Heme/Allergies: Positive for environmental allergies.  Psychiatric/Behavioral: Negative.     Objective:  Physical Exam  Constitutional: He is oriented to person, place, and time. He appears well-developed and well-nourished.  HENT:  Head: Normocephalic and atraumatic.  Mouth/Throat: Oropharynx is clear and moist.  Eyes: Pupils are equal, round, and reactive to light.  Neck: Neck supple. No JVD present. No tracheal deviation present. No  thyromegaly present.  Cardiovascular: Normal rate, regular rhythm, normal heart sounds and intact distal pulses.   Respiratory: Effort normal and breath sounds normal. No stridor. No respiratory distress. He has no wheezes.  GI: Soft. There is no tenderness. There is no guarding.  Musculoskeletal:       Right hip: He exhibits decreased range of motion, decreased strength, tenderness and bony tenderness. He exhibits no swelling, no deformity and no laceration.   Lymphadenopathy:    He has no cervical adenopathy.  Neurological: He is alert and oriented to person, place, and time.  Skin: Skin is warm and dry.  Psychiatric: He has a normal mood and affect.     Labs:  Estimated body mass index is 25.05 kg/(m^2) as calculated from the following:   Height as of 02/14/12: 5\' 7"  (1.702 m).   Weight as of 02/14/12: 72.576 kg (160 lb).   Imaging Review Plain radiographs demonstrate severe degenerative joint disease of the right hip(s). The bone quality appears to be good for age and reported activity level.  Assessment/Plan:  End stage arthritis, right hip(s)  The patient history, physical examination, clinical judgement of the provider and imaging studies are consistent with end stage degenerative joint disease of the right hip(s) and total hip arthroplasty is deemed medically necessary. The treatment options including medical management, injection therapy, arthroscopy and arthroplasty were discussed at length. The risks and benefits of total hip arthroplasty were presented and reviewed. The risks due to aseptic loosening, infection, stiffness, dislocation/subluxation,  thromboembolic complications and other imponderables were discussed.  The patient acknowledged the explanation, agreed to proceed with the plan and consent was signed. Patient is being admitted for inpatient treatment for surgery, pain control, PT, OT, prophylactic antibiotics, VTE prophylaxis, progressive ambulation and ADL's and discharge planning.The patient is planning to be discharged home with home health services.    Anastasio AuerbachMatthew S. Mitra Duling   PAC  06/30/2013, 9:52 AM

## 2013-07-02 ENCOUNTER — Inpatient Hospital Stay (HOSPITAL_COMMUNITY)
Admission: RE | Admit: 2013-07-02 | Discharge: 2013-07-03 | DRG: 470 | Disposition: A | Discharge status: Home Health | Payer: BC Managed Care – PPO | Source: Ambulatory Visit | Attending: Orthopedic Surgery | Admitting: Orthopedic Surgery

## 2013-07-02 ENCOUNTER — Ambulatory Visit (HOSPITAL_COMMUNITY): Payer: BC Managed Care – PPO | Admitting: Anesthesiology

## 2013-07-02 ENCOUNTER — Encounter (HOSPITAL_COMMUNITY): Payer: BC Managed Care – PPO | Admitting: Anesthesiology

## 2013-07-02 ENCOUNTER — Ambulatory Visit (HOSPITAL_COMMUNITY): Payer: BC Managed Care – PPO

## 2013-07-02 ENCOUNTER — Encounter (HOSPITAL_COMMUNITY): Payer: Self-pay | Admitting: Anesthesiology

## 2013-07-02 ENCOUNTER — Encounter (HOSPITAL_COMMUNITY)
Admission: RE | Disposition: A | Discharge status: Home Health | Payer: Self-pay | Source: Ambulatory Visit | Attending: Orthopedic Surgery

## 2013-07-02 DIAGNOSIS — I10 Essential (primary) hypertension: Secondary | ICD-10-CM | POA: Diagnosis present

## 2013-07-02 DIAGNOSIS — Z96649 Presence of unspecified artificial hip joint: Secondary | ICD-10-CM

## 2013-07-02 DIAGNOSIS — D500 Iron deficiency anemia secondary to blood loss (chronic): Secondary | ICD-10-CM | POA: Diagnosis not present

## 2013-07-02 DIAGNOSIS — D62 Acute posthemorrhagic anemia: Secondary | ICD-10-CM | POA: Diagnosis not present

## 2013-07-02 DIAGNOSIS — Z8249 Family history of ischemic heart disease and other diseases of the circulatory system: Secondary | ICD-10-CM

## 2013-07-02 DIAGNOSIS — E663 Overweight: Secondary | ICD-10-CM | POA: Diagnosis present

## 2013-07-02 DIAGNOSIS — E785 Hyperlipidemia, unspecified: Secondary | ICD-10-CM | POA: Diagnosis present

## 2013-07-02 DIAGNOSIS — M1610 Unilateral primary osteoarthritis, unspecified hip: Principal | ICD-10-CM | POA: Diagnosis present

## 2013-07-02 DIAGNOSIS — E876 Hypokalemia: Secondary | ICD-10-CM | POA: Diagnosis not present

## 2013-07-02 DIAGNOSIS — E119 Type 2 diabetes mellitus without complications: Secondary | ICD-10-CM | POA: Diagnosis present

## 2013-07-02 DIAGNOSIS — M169 Osteoarthritis of hip, unspecified: Principal | ICD-10-CM | POA: Diagnosis present

## 2013-07-02 DIAGNOSIS — K219 Gastro-esophageal reflux disease without esophagitis: Secondary | ICD-10-CM | POA: Diagnosis present

## 2013-07-02 DIAGNOSIS — Z8711 Personal history of peptic ulcer disease: Secondary | ICD-10-CM

## 2013-07-02 DIAGNOSIS — Z6825 Body mass index (BMI) 25.0-25.9, adult: Secondary | ICD-10-CM

## 2013-07-02 HISTORY — PX: TOTAL HIP ARTHROPLASTY: SHX124

## 2013-07-02 LAB — GLUCOSE, CAPILLARY
GLUCOSE-CAPILLARY: 116 mg/dL — AB (ref 70–99)
Glucose-Capillary: 143 mg/dL — ABNORMAL HIGH (ref 70–99)
Glucose-Capillary: 176 mg/dL — ABNORMAL HIGH (ref 70–99)
Glucose-Capillary: 124 mg/dL — ABNORMAL HIGH (ref 70–99)

## 2013-07-02 LAB — TYPE AND SCREEN
ABO/RH(D): B POS
ANTIBODY SCREEN: NEGATIVE

## 2013-07-02 IMAGING — CR DG PORTABLE PELVIS
1 series · 1 of 1 positions shown · non-contrast
Comparison: None.

CLINICAL DATA: Postop from right hip arthroplasty.

EXAM:
PORTABLE PELVIS 1-2 VIEWS

[AP]
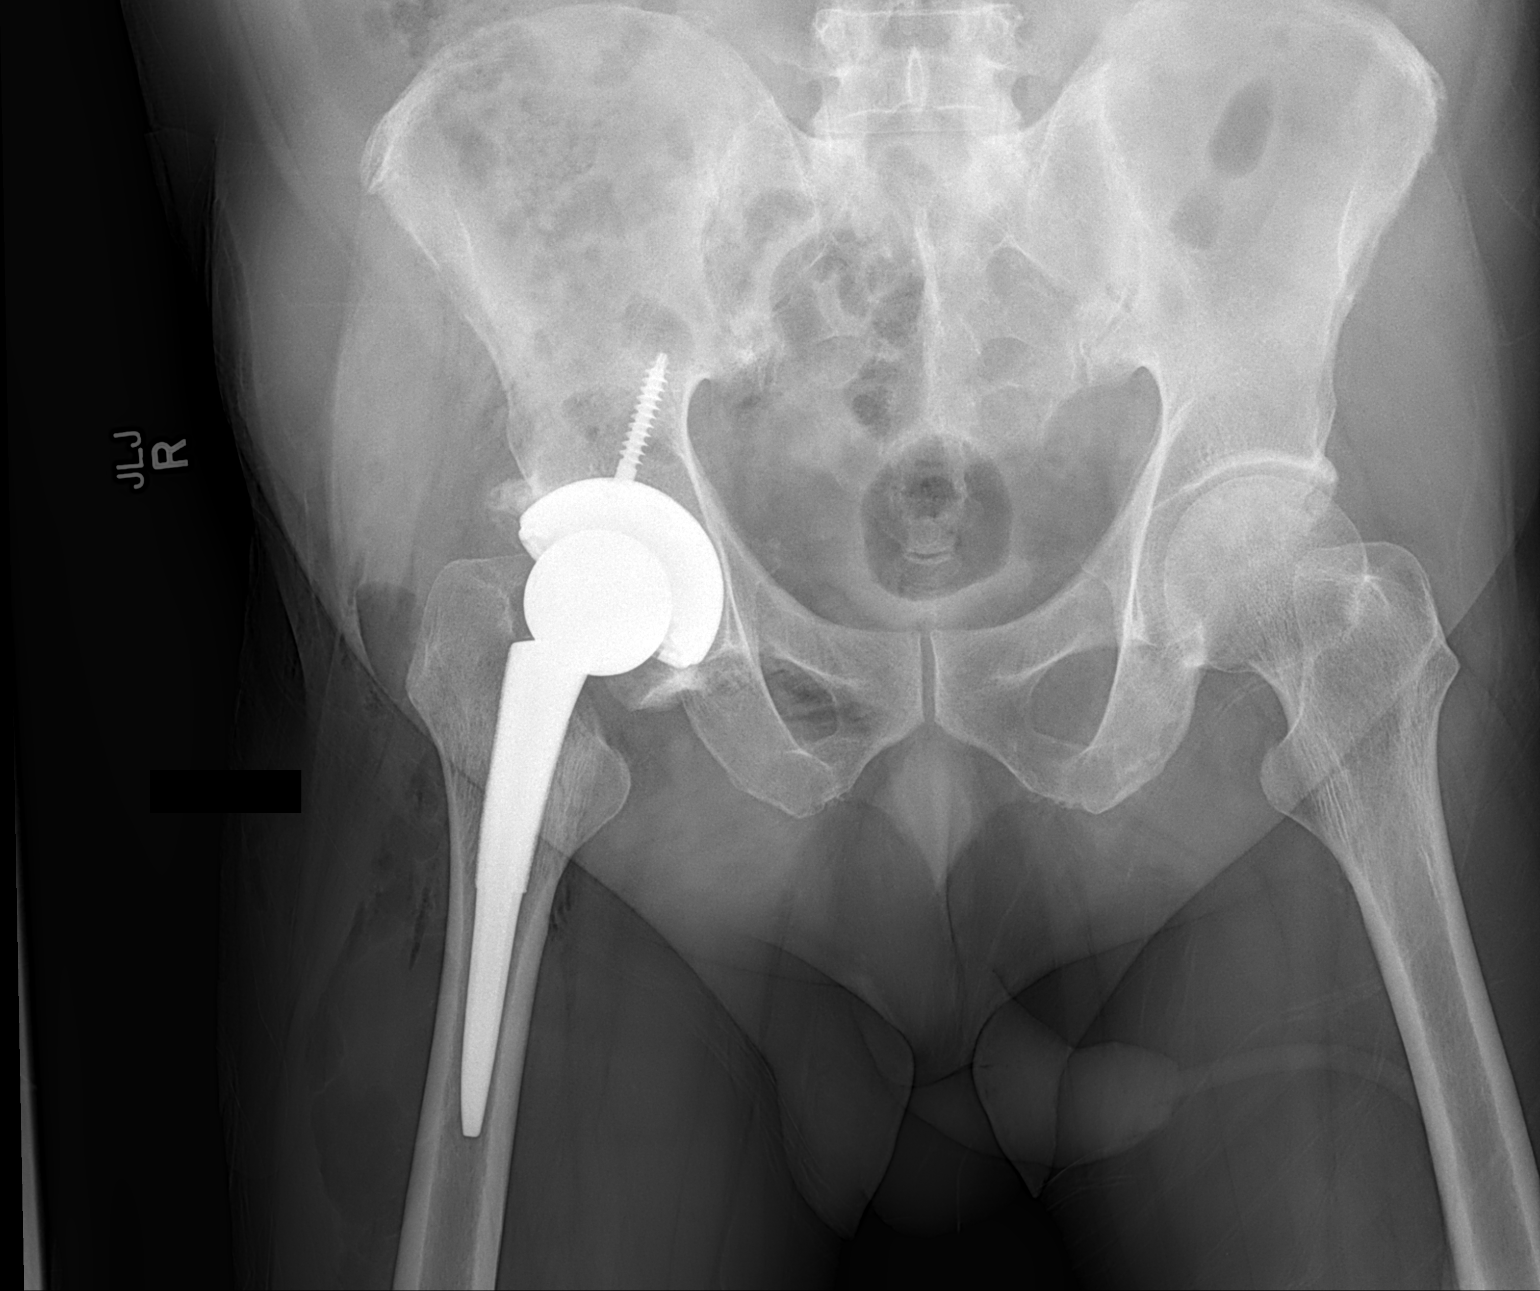

[1 of 1 positions shown; findings below may reference images not displayed]

FINDINGS: Bipolar right hip prosthesis is seen in expected position. No
evidence of fracture or dislocation. No other complication
identified.
IMPRESSION: Expected postoperative appearance of right hip prosthesis.

## 2013-07-02 SURGERY — ARTHROPLASTY, HIP, TOTAL, ANTERIOR APPROACH
Anesthesia: Spinal | Site: Hip | Laterality: Right

## 2013-07-02 MED ORDER — METOPROLOL SUCCINATE ER 100 MG PO TB24
100.0000 mg | ORAL_TABLET | Freq: Every morning | ORAL | Status: DC
  Administered 2013-07-03: 100 mg via ORAL
  Filled 2013-07-02: qty 1

## 2013-07-02 MED ORDER — METHOCARBAMOL 100 MG/ML IJ SOLN
500.0000 mg | Freq: Four times a day (QID) | INTRAVENOUS | Status: DC | PRN
  Administered 2013-07-02: 500 mg via INTRAVENOUS
  Filled 2013-07-02: qty 5

## 2013-07-02 MED ORDER — ONDANSETRON HCL 4 MG PO TABS: 4.0000 mg | ORAL_TABLET | Freq: Four times a day (QID) | ORAL | Status: DC | PRN

## 2013-07-02 MED ORDER — ASPIRIN EC 325 MG PO TBEC
325.0000 mg | DELAYED_RELEASE_TABLET | Freq: Two times a day (BID) | ORAL | Status: DC
Start: 2013-07-03 — End: 2013-07-03
  Administered 2013-07-03: 325 mg via ORAL
  Filled 2013-07-02 (×3): qty 1

## 2013-07-02 MED ORDER — PHENYLEPHRINE HCL 10 MG/ML IJ SOLN
10.0000 mg | INTRAVENOUS | Status: DC | PRN
  Administered 2013-07-02: 10 ug/min via INTRAVENOUS

## 2013-07-02 MED ORDER — CEFAZOLIN SODIUM 1-5 GM-% IV SOLN
1.0000 g | Freq: Four times a day (QID) | INTRAVENOUS | Status: AC
  Administered 2013-07-02 (×2): 1 g via INTRAVENOUS
  Filled 2013-07-02 (×2): qty 50

## 2013-07-02 MED ORDER — MENTHOL 3 MG MT LOZG
1.0000 | LOZENGE | OROMUCOSAL | Status: DC | PRN
  Filled 2013-07-02: qty 9

## 2013-07-02 MED ORDER — 0.9 % SODIUM CHLORIDE (POUR BTL) OPTIME
TOPICAL | Status: DC | PRN
  Administered 2013-07-02: 1000 mL

## 2013-07-02 MED ORDER — LACTATED RINGERS IV SOLN: INTRAVENOUS | Status: DC

## 2013-07-02 MED ORDER — PROPOFOL 10 MG/ML IV BOLUS
INTRAVENOUS | Status: AC
  Filled 2013-07-02: qty 20

## 2013-07-02 MED ORDER — SENNA 8.6 MG PO TABS
1.0000 | ORAL_TABLET | Freq: Two times a day (BID) | ORAL | Status: DC
  Administered 2013-07-02 – 2013-07-03 (×2): 8.6 mg via ORAL

## 2013-07-02 MED ORDER — LIDOCAINE HCL (CARDIAC) 20 MG/ML IV SOLN
INTRAVENOUS | Status: DC | PRN
  Administered 2013-07-02: 50 mg via INTRAVENOUS

## 2013-07-02 MED ORDER — INSULIN ASPART 100 UNIT/ML ~~LOC~~ SOLN: 0.0000 [IU] | Freq: Three times a day (TID) | SUBCUTANEOUS | Status: DC

## 2013-07-02 MED ORDER — TRANEXAMIC ACID 100 MG/ML IV SOLN
1000.0000 mg | Freq: Once | INTRAVENOUS | Status: AC
  Administered 2013-07-02: 1000 mg via INTRAVENOUS
  Filled 2013-07-02: qty 10

## 2013-07-02 MED ORDER — PHENOL 1.4 % MT LIQD: 1.0000 | OROMUCOSAL | Status: DC | PRN

## 2013-07-02 MED ORDER — MIDAZOLAM HCL 5 MG/5ML IJ SOLN
INTRAMUSCULAR | Status: DC | PRN
  Administered 2013-07-02: 2 mg via INTRAVENOUS

## 2013-07-02 MED ORDER — PANTOPRAZOLE SODIUM 40 MG PO TBEC
80.0000 mg | DELAYED_RELEASE_TABLET | Freq: Every day | ORAL | Status: DC
  Filled 2013-07-02: qty 2

## 2013-07-02 MED ORDER — HYDROMORPHONE HCL PF 1 MG/ML IJ SOLN: 0.2500 mg | INTRAMUSCULAR | Status: DC | PRN

## 2013-07-02 MED ORDER — HYDROCODONE-ACETAMINOPHEN 7.5-325 MG PO TABS
1.0000 | ORAL_TABLET | ORAL | Status: DC
  Administered 2013-07-02 – 2013-07-03 (×3): 1 via ORAL
  Filled 2013-07-02: qty 2
  Filled 2013-07-02 (×2): qty 1
  Filled 2013-07-02: qty 2

## 2013-07-02 MED ORDER — PHENYLEPHRINE HCL 10 MG/ML IJ SOLN
INTRAMUSCULAR | Status: AC
  Filled 2013-07-02: qty 1

## 2013-07-02 MED ORDER — CEFAZOLIN SODIUM-DEXTROSE 2-3 GM-% IV SOLR
2.0000 g | INTRAVENOUS | Status: AC
  Administered 2013-07-02: 2 g via INTRAVENOUS

## 2013-07-02 MED ORDER — SIMVASTATIN 40 MG PO TABS
40.0000 mg | ORAL_TABLET | Freq: Every day | ORAL | Status: DC
  Administered 2013-07-02: 40 mg via ORAL
  Filled 2013-07-02 (×2): qty 1

## 2013-07-02 MED ORDER — HYDROMORPHONE HCL PF 1 MG/ML IJ SOLN
INTRAMUSCULAR | Status: AC
  Filled 2013-07-02: qty 1

## 2013-07-02 MED ORDER — BUPIVACAINE HCL (PF) 0.5 % IJ SOLN
INTRAMUSCULAR | Status: AC
  Filled 2013-07-02: qty 30

## 2013-07-02 MED ORDER — POLYETHYLENE GLYCOL 3350 17 G PO PACK: 17.0000 g | PACK | Freq: Every day | ORAL | Status: DC | PRN

## 2013-07-02 MED ORDER — ONDANSETRON HCL 4 MG/2ML IJ SOLN
INTRAMUSCULAR | Status: AC
  Filled 2013-07-02: qty 2

## 2013-07-02 MED ORDER — CHLORHEXIDINE GLUCONATE 4 % EX LIQD: 60.0000 mL | Freq: Once | CUTANEOUS | Status: DC

## 2013-07-02 MED ORDER — BUPIVACAINE HCL (PF) 0.5 % IJ SOLN
INTRAMUSCULAR | Status: DC | PRN
  Administered 2013-07-02: 3 mL

## 2013-07-02 MED ORDER — MIDAZOLAM HCL 2 MG/2ML IJ SOLN
INTRAMUSCULAR | Status: AC
  Filled 2013-07-02: qty 2

## 2013-07-02 MED ORDER — ONDANSETRON HCL 4 MG/2ML IJ SOLN: 4.0000 mg | Freq: Four times a day (QID) | INTRAMUSCULAR | Status: DC | PRN

## 2013-07-02 MED ORDER — METFORMIN HCL ER 500 MG PO TB24
500.0000 mg | ORAL_TABLET | Freq: Every day | ORAL | Status: DC
  Administered 2013-07-03: 500 mg via ORAL
  Filled 2013-07-02 (×2): qty 1

## 2013-07-02 MED ORDER — MOMETASONE FURO-FORMOTEROL FUM 200-5 MCG/ACT IN AERO
2.0000 | INHALATION_SPRAY | Freq: Two times a day (BID) | RESPIRATORY_TRACT | Status: DC
  Administered 2013-07-03: 2 via RESPIRATORY_TRACT
  Filled 2013-07-02: qty 8.8

## 2013-07-02 MED ORDER — FERROUS SULFATE 325 (65 FE) MG PO TABS
325.0000 mg | ORAL_TABLET | Freq: Three times a day (TID) | ORAL | Status: DC
  Administered 2013-07-03: 325 mg via ORAL
  Filled 2013-07-02 (×4): qty 1

## 2013-07-02 MED ORDER — ONDANSETRON HCL 4 MG/2ML IJ SOLN
INTRAMUSCULAR | Status: DC | PRN
  Administered 2013-07-02: 4 mg via INTRAVENOUS

## 2013-07-02 MED ORDER — LACTATED RINGERS IV SOLN
INTRAVENOUS | Status: DC | PRN
  Administered 2013-07-02 (×3): via INTRAVENOUS

## 2013-07-02 MED ORDER — DIPHENHYDRAMINE HCL 12.5 MG/5ML PO ELIX: 25.0000 mg | ORAL_SOLUTION | Freq: Four times a day (QID) | ORAL | Status: DC | PRN

## 2013-07-02 MED ORDER — DOCUSATE SODIUM 100 MG PO CAPS
100.0000 mg | ORAL_CAPSULE | Freq: Two times a day (BID) | ORAL | Status: DC
  Administered 2013-07-02 – 2013-07-03 (×2): 100 mg via ORAL

## 2013-07-02 MED ORDER — PROPOFOL INFUSION 10 MG/ML OPTIME
INTRAVENOUS | Status: DC | PRN
  Administered 2013-07-02: 100 ug/kg/min via INTRAVENOUS

## 2013-07-02 MED ORDER — PROMETHAZINE HCL 25 MG/ML IJ SOLN: 6.2500 mg | INTRAMUSCULAR | Status: DC | PRN

## 2013-07-02 MED ORDER — CEFAZOLIN SODIUM-DEXTROSE 2-3 GM-% IV SOLR
INTRAVENOUS | Status: AC
  Filled 2013-07-02: qty 50

## 2013-07-02 MED ORDER — METHOCARBAMOL 500 MG PO TABS: 500.0000 mg | ORAL_TABLET | Freq: Four times a day (QID) | ORAL | Status: DC | PRN

## 2013-07-02 MED ORDER — POTASSIUM CHLORIDE 2 MEQ/ML IV SOLN
INTRAVENOUS | Status: DC
  Administered 2013-07-02: 11:00:00 via INTRAVENOUS
  Filled 2013-07-02 (×3): qty 1000

## 2013-07-02 MED ORDER — FENTANYL CITRATE 0.05 MG/ML IJ SOLN
INTRAMUSCULAR | Status: AC
  Filled 2013-07-02: qty 2

## 2013-07-02 MED ORDER — HYDROMORPHONE HCL PF 1 MG/ML IJ SOLN
0.5000 mg | INTRAMUSCULAR | Status: DC | PRN
  Administered 2013-07-02: 0.5 mg via INTRAVENOUS
  Filled 2013-07-02: qty 1

## 2013-07-02 MED ORDER — ALUM & MAG HYDROXIDE-SIMETH 200-200-20 MG/5ML PO SUSP: 30.0000 mL | ORAL | Status: DC | PRN

## 2013-07-02 MED ORDER — HYDROMORPHONE HCL PF 1 MG/ML IJ SOLN
0.5000 mg | INTRAMUSCULAR | Status: DC | PRN
  Administered 2013-07-02: 1 mg via INTRAVENOUS
  Filled 2013-07-02: qty 1

## 2013-07-02 MED ORDER — FENTANYL CITRATE 0.05 MG/ML IJ SOLN
INTRAMUSCULAR | Status: DC | PRN
Start: 2013-07-02 — End: 2013-07-02
  Administered 2013-07-02: 100 ug via INTRAVENOUS

## 2013-07-02 MED ORDER — LIDOCAINE HCL (CARDIAC) 20 MG/ML IV SOLN
INTRAVENOUS | Status: AC
  Filled 2013-07-02: qty 5

## 2013-07-02 SURGICAL SUPPLY — 38 items
BAG ZIPLOCK 12X15 (MISCELLANEOUS) IMPLANT
BLADE SAW SGTL 18X1.27X75 (BLADE) ×2 IMPLANT
BLADE SAW SGTL 18X1.27X75MM (BLADE) ×1
CAPT HIP PF COP ×3 IMPLANT
DERMABOND ADVANCED (GAUZE/BANDAGES/DRESSINGS) ×2
DERMABOND ADVANCED .7 DNX12 (GAUZE/BANDAGES/DRESSINGS) ×1 IMPLANT
DRAPE C-ARM 42X120 X-RAY (DRAPES) ×3 IMPLANT
DRAPE STERI IOBAN 125X83 (DRAPES) ×3 IMPLANT
DRAPE U-SHAPE 47X51 STRL (DRAPES) ×9 IMPLANT
DRSG AQUACEL AG ADV 3.5X10 (GAUZE/BANDAGES/DRESSINGS) ×3 IMPLANT
DRSG TEGADERM 4X4.75 (GAUZE/BANDAGES/DRESSINGS) IMPLANT
DURAPREP 26ML APPLICATOR (WOUND CARE) ×3 IMPLANT
ELECT BLADE TIP CTD 4 INCH (ELECTRODE) ×3 IMPLANT
ELECT REM PT RETURN 9FT ADLT (ELECTROSURGICAL) ×3
ELECTRODE REM PT RTRN 9FT ADLT (ELECTROSURGICAL) ×1 IMPLANT
EVACUATOR 1/8 PVC DRAIN (DRAIN) IMPLANT
FACESHIELD LNG OPTICON STERILE (SAFETY) ×12 IMPLANT
GAUZE SPONGE 2X2 8PLY STRL LF (GAUZE/BANDAGES/DRESSINGS) IMPLANT
GLOVE BIOGEL PI IND STRL 7.5 (GLOVE) ×1 IMPLANT
GLOVE BIOGEL PI IND STRL 8 (GLOVE) ×1 IMPLANT
GLOVE BIOGEL PI INDICATOR 7.5 (GLOVE) ×2
GLOVE BIOGEL PI INDICATOR 8 (GLOVE) ×2
GLOVE ECLIPSE 8.0 STRL XLNG CF (GLOVE) ×3 IMPLANT
GLOVE ORTHO TXT STRL SZ7.5 (GLOVE) ×6 IMPLANT
GOWN SPEC L3 XXLG W/TWL (GOWN DISPOSABLE) ×3 IMPLANT
GOWN STRL REUS W/TWL LRG LVL3 (GOWN DISPOSABLE) ×3 IMPLANT
KIT BASIN OR (CUSTOM PROCEDURE TRAY) ×3 IMPLANT
PACK TOTAL JOINT (CUSTOM PROCEDURE TRAY) ×3 IMPLANT
PADDING CAST COTTON 6X4 STRL (CAST SUPPLIES) ×3 IMPLANT
SPONGE GAUZE 2X2 STER 10/PKG (GAUZE/BANDAGES/DRESSINGS)
SUT MNCRL AB 4-0 PS2 18 (SUTURE) ×3 IMPLANT
SUT VIC AB 1 CT1 36 (SUTURE) ×9 IMPLANT
SUT VIC AB 2-0 CT1 27 (SUTURE) ×4
SUT VIC AB 2-0 CT1 TAPERPNT 27 (SUTURE) ×2 IMPLANT
SUT VLOC 180 0 24IN GS25 (SUTURE) ×3 IMPLANT
TOWEL OR 17X26 10 PK STRL BLUE (TOWEL DISPOSABLE) ×3 IMPLANT
TOWEL OR NON WOVEN STRL DISP B (DISPOSABLE) IMPLANT
TRAY FOLEY CATH 14FRSI W/METER (CATHETERS) ×3 IMPLANT

## 2013-07-02 NOTE — Op Note (Signed)
NAME:  Justin [name] NO.: [id number]      MEDICAL RECORD NO.: [id number]      FACILITY:  [facility]      PHYSICIAN:  Durene Romans D  DATE OF BIRTH:  October 12, 1955     DATE OF PROCEDURE:  07/02/2013                                 OPERATIVE REPORT         PREOPERATIVE DIAGNOSIS: Right  hip osteoarthritis.      POSTOPERATIVE DIAGNOSIS:  Right hip osteoarthritis.      PROCEDURE:  Right total hip replacement through an anterior approach   utilizing DePuy THR system, component size 56mm pinnacle cup, a size 36+4 neutral   Altrex liner, a size 4Hi Tri Lock stem with a 36+1.5 delta ceramic   ball.      SURGEON:  Madlyn Frankel. Charlann Boxer, M.D.      ASSISTANT:  Leilani Able, PA-C     ANESTHESIA:  Spinal.      SPECIMENS:  None.      COMPLICATIONS:  None.      BLOOD LOSS:  400 cc     DRAINS:  None      INDICATION OF THE PROCEDURE:  Justin [name] is a 58 y.o. male who had   presented to office for evaluation of right hip pain.  Radiographs revealed   progressive degenerative changes with bone-on-bone   articulation to the  hip joint.  The patient had painful limited range of   motion significantly affecting their overall quality of life.  The patient was failing to    respond to conservative measures, and at this point was ready   to proceed with more definitive measures.  The patient has noted progressive   degenerative changes in his hip, progressive problems and dysfunction   with regarding the hip prior to surgery.  Consent was obtained for   benefit of pain relief.  Specific risk of infection, DVT, component   failure, dislocation, need for revision surgery, as well discussion of   the anterior versus posterior approach were reviewed.  Consent was   obtained for benefit of anterior pain relief through an anterior   approach.      PROCEDURE IN DETAIL:  The patient was brought to operative theater.   Once adequate anesthesia, preoperative  antibiotics, 2gm Ancef administered.   The patient was positioned supine on the OSI Hanna table.  Once adequate   padding of boney process was carried out, we had predraped out the hip, and  used fluoroscopy to confirm orientation of the pelvis and position.      The right hip was then prepped and draped from proximal iliac crest to   mid thigh with shower curtain technique.      Time-out was performed identifying the patient, planned procedure, and   extremity.     An incision was then made 2 cm distal and lateral to the   anterior superior iliac spine extending over the orientation of the   tensor fascia lata muscle and sharp dissection was carried down to the   fascia of the muscle and protractor placed in the soft tissues.      The fascia was then incised.  The muscle belly was identified and swept  laterally and retractor placed along the superior neck.  Following   cauterization of the circumflex vessels and removing some pericapsular   fat, a second cobra retractor was placed on the inferior neck.  A third   retractor was placed on the anterior acetabulum after elevating the   anterior rectus.  A L-capsulotomy was along the line of the   superior neck to the trochanteric fossa, then extended proximally and   distally.  Tag sutures were placed and the retractors were then placed   intracapsular.  We then identified the trochanteric fossa and   orientation of my neck cut, confirmed this radiographically   and then made a neck osteotomy with the femur on traction.  The femoral   head was removed without difficulty or complication.  Traction was let   off and retractors were placed posterior and anterior around the   acetabulum.      The labrum and foveal tissue were debrided.  I began reaming with a 49mm   reamer and reamed up to 55mm reamer with good bony bed preparation and a 56 cup was chosen.  The final 56mm Pinnacle cup was then impacted under fluoroscopy  to confirm the  depth of penetration and orientation with respect to   abduction.  A screw was placed followed by the hole eliminator.  The final   36+4 neutral Altrex liner was impacted with good visualized rim fit.  The cup was positioned anatomically within the acetabular portion of the pelvis.      At this point, the femur was rolled at 80 degrees.  Further capsule was   released off the inferior aspect of the femoral neck.  I then   released the superior capsule proximally.  The hook was placed laterally   along the femur and elevated manually and held in position with the bed   hook.  The leg was then extended and adducted with the leg rolled to 100   degrees of external rotation.  Once the proximal femur was fully   exposed, I used a box osteotome to set orientation.  I then began   broaching with the starting chili pepper broach and passed this by hand and then broached up to 4.  With the 4 broach in place I chose a high offset neck and did a trial reduction.  The offset was appropriate, leg lengths   appeared to be equal, confirmed radiographically.   Given these findings, I went ahead and dislocated the hip, repositioned all   retractors and positioned the right hip in the extended and abducted position.  The final 4 Hi Tri Lock stem was   chosen and it was impacted down to the level of neck cut.  Based on this   and the trial reduction, a 36+1.5 delta ceramic ball was chosen and   impacted onto a clean and dry trunnion, and the hip was reduced.  The   hip had been irrigated throughout the case again at this point.  I did   reapproximate the superior capsular leaflet to the anterior leaflet   using #1 Vicryl.  The fascia of the   tensor fascia lata muscle was then reapproximated using #1 Vicryl and #0 V-lock sutures.  The   remaining wound was closed with 2-0 Vicryl and running 4-0 Monocryl.   The hip was cleaned, dried, and dressed sterilely using Dermabond and   Aquacel dressing.  She was then  brought   to recovery room in stable  condition tolerating the procedure well.    Leilani Able, PA-C was present for the entirety of the case involved from   preoperative positioning, perioperative retractor management, general   facilitation of the case, as well as primary wound closure as assistant.            Madlyn Frankel Charlann Boxer, M.D.        07/02/2013 8:58 AM

## 2013-07-02 NOTE — Interval H&P Note (Signed)
History and Physical Interval Note:  07/02/2013 6:53 AM  Justin [name]  has presented today for surgery, with the diagnosis of RIGHT HIP OA  The various methods of treatment have been discussed with the patient and family. After consideration of risks, benefits and other options for treatment, the patient has consented to  Procedure(s): RIGHT TOTAL HIP ARTHROPLASTY ANTERIOR APPROACH (Right) as a surgical intervention .  The patient's history has been reviewed, patient examined, no change in status, stable for surgery.  I have reviewed the patient's chart and labs.  Questions were answered to the patient's satisfaction.     Shelda PalLIN,Benjimen Kelley D

## 2013-07-02 NOTE — Progress Notes (Signed)
Portable AP Pelvis and Lateral Right Hip X-rays done. 

## 2013-07-02 NOTE — Progress Notes (Signed)
X-ray results noted 

## 2013-07-02 NOTE — Evaluation (Signed)
Physical Therapy Evaluation Patient Details Name: Justin [name] MRN: [id number] DOB: 02/06/1956 Today's Date: 07/02/2013   History of Present Illness  R DA THA  Clinical Impression  Pt amb x 80'. Pt will benefit from PT to address problems listed . Plans Dc home    Follow Up Recommendations Home health PT    Equipment Recommendations  Rolling walker with 5" wheels    Recommendations for Other Services       Precautions / Restrictions Precautions Precautions: Fall      Mobility  Bed Mobility Overal bed mobility: Needs Assistance Bed Mobility: Supine to Sit     Supine to sit: Min assist     General bed mobility comments: cues on technique  Transfers Overall transfer level: Needs assistance Equipment used: Rolling walker (2 wheeled) Transfers: Sit to/from Stand Sit to Stand: Min assist         General transfer comment: cues for UE and RLE position  Ambulation/Gait Ambulation/Gait assistance: Min assist Ambulation Distance (Feet): 80 Feet Assistive device: Rolling walker (2 wheeled) Gait Pattern/deviations: Step-to pattern;Step-through pattern;Antalgic     General Gait Details: sequnce cues  Stairs            Wheelchair Mobility    Modified Rankin (Stroke Patients Only)       Balance                                     Pertinent Vitals/Pain Thigh is sore    Home Living Family/patient expects to be discharged to:: Private residence Living Arrangements: Spouse/significant other Available Help at Discharge: Family Type of Home: House Home Access: Stairs to enter Entrance Stairs-Rails: Doctor, general practiceight;Left Entrance Stairs-Number of Steps: 3 Home Layout: Two level Home Equipment: None      Prior Function Level of Independence: Independent               Hand Dominance        Extremity/Trunk Assessment   Upper Extremity Assessment: Overall WFL for tasks assessed           Lower Extremity Assessment: RLE  deficits/detail RLE Deficits / Details: able to advance        Communication   Communication: No difficulties  Cognition Arousal/Alertness: Awake/alert Behavior During Therapy: WFL for tasks assessed/performed Overall Cognitive Status: Within Functional Limits for tasks assessed                      General Comments      Exercises Total Joint Exercises Heel Slides: AAROM;Right;5 reps;Supine      Assessment/Plan    PT Assessment Patient needs continued PT services  PT Diagnosis Difficulty walking   PT Problem List Decreased strength;Decreased range of motion;Decreased activity tolerance;Decreased mobility;Decreased safety awareness;Decreased knowledge of precautions;Decreased knowledge of use of DME;Pain  PT Treatment Interventions DME instruction;Gait training;Stair training;Functional mobility training;Therapeutic activities;Therapeutic exercise;Patient/family education   PT Goals (Current goals can be found in the Care Plan section) Acute Rehab PT Goals Patient Stated Goal: I want to walk PT Goal Formulation: With patient/family Time For Goal Achievement: 07/06/13 Potential to Achieve Goals: Good    Frequency 7X/week   Barriers to discharge        End of Session Equipment Utilized During Treatment: Gait belt Activity Tolerance: Patient tolerated treatment well Patient left: in chair;with call bell/phone within reach;with family/visitor present         Time: [id number] PT  Time Calculation (min): 22 min   Charges:   PT Evaluation $Initial PT Evaluation Tier I: 1 Procedure PT Treatments $Gait Training: 8-22 mins   PT G Codes:          Rada Hay 07/02/2013, 6:20 PM

## 2013-07-02 NOTE — Progress Notes (Signed)
   CARE MANAGEMENT NOTE 07/02/2013  Patient:  Dayle PointsSHAH,Shandy K   Account Number:  [id number]  Date Initiated:  07/02/2013  Documentation initiated by:  [facility],Taetum Flewellen  Subjective/Objective Assessment:   RIGHT TOTAL HIP ARTHROPLASTY ANTERIOR APPROACH     Action/Plan:   HH vs SNF, waiting recommendation from PT   Anticipated DC Date:  07/05/2013   Anticipated DC Plan:  HOME W HOME HEALTH SERVICES      DC Planning Services  CM consult      [facility]   Choice offered to / List presented to:  C-1 Patient           Status of service:  Completed, signed off Medicare Important Message given?   (If response is "NO", the following Medicare IM given date fields will be blank) Date Medicare IM given:   Date Additional Medicare IM given:    Discharge Disposition:  HOME W HOME HEALTH SERVICES  Per UR Regulation:    If discussed at Long Length of Stay Meetings, dates discussed:    Comments:  07/02/2013 1820 NCM spoke to pt and [facility]. Pt states he may want SNF at dc. Wants to wait to see how he does with therapy. Has RW at home. Isidoro DonningAlesia Tilley Faeth RN CCM Case Mgmt phone [phone]

## 2013-07-02 NOTE — Anesthesia Preprocedure Evaluation (Addendum)
Anesthesia Evaluation  Patient identified by MRN, date of birth, ID band Patient awake    Reviewed: Allergy & Precautions, H&P , NPO status , Patient's Chart, lab work & pertinent test results  Airway Mallampati: II TM Distance: >3 FB Neck ROM: Full    Dental no notable dental hx.    Pulmonary neg pulmonary ROS,  CXR: COPD. No acute disease. breath sounds clear to auscultation  Pulmonary exam normal       Cardiovascular Exercise Tolerance: Good hypertension, Pt. on medications and Pt. on home beta blockers Rhythm:Regular Rate:Normal     Neuro/Psych negative neurological ROS  negative psych ROS   GI/Hepatic Neg liver ROS, GERD-  Medicated,  Endo/Other  negative endocrine ROSdiabetes, Type 2, Oral Hypoglycemic Agents  Renal/GU negative Renal ROS  negative genitourinary   Musculoskeletal negative musculoskeletal ROS (+)   Abdominal   Peds negative pediatric ROS (+)  Hematology negative hematology ROS (+)   Anesthesia Other Findings   Reproductive/Obstetrics negative OB ROS                          Anesthesia Physical Anesthesia Plan  ASA: II  Anesthesia Plan: Spinal   Post-op Pain Management:    Induction: Intravenous  Airway Management Planned:   Additional Equipment:   Intra-op Plan:   Post-operative Plan:   Informed Consent: I have reviewed the patients History and Physical, chart, labs and discussed the procedure including the risks, benefits and alternatives for the proposed anesthesia with the patient or authorized representative who has indicated his/her understanding and acceptance.   Dental advisory given  Plan Discussed with: CRNA  Anesthesia Plan Comments: (Discussed r/b general versus spinal. Slight cough. No fevers. No malaise. No SOB. Allergic rhinitis. Seasonal allergies. Discussed risks/benefits of spinal including headache, backache, failure, bleeding,  infection, and nerve damage. Patient consents to spinal. Questions answered. Coagulation studies and platelet count acceptable.)       Anesthesia Quick Evaluation

## 2013-07-02 NOTE — Anesthesia Procedure Notes (Signed)
Spinal  Patient location during procedure: OR End time: 07/02/2013 7:34 AM Staffing CRNA/Resident: Noralyn Pick Performed by: anesthesiologist  Preanesthetic Checklist Completed: patient identified, site marked, surgical consent, pre-op evaluation, timeout performed, IV checked, risks and benefits discussed and monitors and equipment checked Spinal Block Patient position: sitting Prep: Betadine Patient monitoring: heart rate, continuous pulse ox and blood pressure Approach: midline Location: L2-3 Injection technique: single-shot Needle Needle type: Sprotte  Needle gauge: 24 G Needle length: 9 cm Assessment Sensory level: T6 Additional Notes Expiration date of kit checked and confirmed. Patient tolerated procedure well, without complications.

## 2013-07-02 NOTE — Anesthesia Postprocedure Evaluation (Signed)
  Anesthesia Post-op Note  Patient: Justin [name]  Procedure(s) Performed: Procedure(s) (LRB): RIGHT TOTAL HIP ARTHROPLASTY ANTERIOR APPROACH (Right)  Patient Location: PACU  Anesthesia Type: Spinal  Level of Consciousness: awake and alert   Airway and Oxygen Therapy: Patient Spontanous Breathing  Post-op Pain: mild  Post-op Assessment: Post-op Vital signs reviewed, Patient's Cardiovascular Status Stable, Respiratory Function Stable, Patent Airway and No signs of Nausea or vomiting  Last Vitals:  Filed Vitals:   07/02/13 1035  BP: 116/78  Pulse: 75  Temp: 36.4 C  Resp: 14    Post-op Vital Signs: stable   Complications: No apparent anesthesia complications

## 2013-07-02 NOTE — Transfer of Care (Signed)
Immediate Anesthesia Transfer of Care Note  Patient: Dayle PointsKiran K Seder  Procedure(s) Performed: Procedure(s): RIGHT TOTAL HIP ARTHROPLASTY ANTERIOR APPROACH (Right)  Patient Location: PACU  Anesthesia Type:Regional  Level of Consciousness: awake, alert  and oriented  Airway & Oxygen Therapy: Patient Spontanous Breathing and Patient connected to face mask oxygen  Post-op Assessment: Report given to PACU RN and Post -op Vital signs reviewed and stable  Post vital signs: Reviewed and stable  Complications: No apparent anesthesia complications

## 2013-07-03 DIAGNOSIS — E663 Overweight: Secondary | ICD-10-CM | POA: Diagnosis present

## 2013-07-03 DIAGNOSIS — E876 Hypokalemia: Secondary | ICD-10-CM | POA: Diagnosis not present

## 2013-07-03 DIAGNOSIS — D500 Iron deficiency anemia secondary to blood loss (chronic): Secondary | ICD-10-CM | POA: Diagnosis not present

## 2013-07-03 LAB — BASIC METABOLIC PANEL
BUN: 10 mg/dL (ref 6–23)
CHLORIDE: 97 meq/L (ref 96–112)
CO2: 29 meq/L (ref 19–32)
Calcium: 8.5 mg/dL (ref 8.4–10.5)
Creatinine, Ser: 0.71 mg/dL (ref 0.50–1.35)
GFR calc Af Amer: >90 mL/min (ref >90)
GFR calc non Af Amer: >90 mL/min (ref >90)
GLUCOSE: 149 mg/dL — AB (ref 70–99)
POTASSIUM: 3.3 meq/L — AB (ref 3.7–5.3)
SODIUM: 137 meq/L (ref 137–147)

## 2013-07-03 LAB — CBC
HCT: 36.9 % — ABNORMAL LOW (ref 39.0–52.0)
Hemoglobin: 12 g/dL — ABNORMAL LOW (ref 13.0–17.0)
MCH: 26.3 pg (ref 26.0–34.0)
MCHC: 32.5 g/dL (ref 30.0–36.0)
MCV: 80.7 fL (ref 78.0–100.0)
PLATELETS: 169 10*3/uL (ref 150–400)
RBC: 4.57 MIL/uL (ref 4.22–5.81)
RDW: 14 % (ref 11.5–15.5)
WBC: 8.9 10*3/uL (ref 4.0–10.5)

## 2013-07-03 LAB — GLUCOSE, CAPILLARY: GLUCOSE-CAPILLARY: 137 mg/dL — AB (ref 70–99)

## 2013-07-03 MED ORDER — HYDROCODONE-ACETAMINOPHEN 7.5-325 MG PO TABS: 1.0000 | ORAL_TABLET | ORAL | Status: DC | PRN

## 2013-07-03 MED ORDER — POLYETHYLENE GLYCOL 3350 17 G PO PACK: 17.0000 g | PACK | Freq: Every day | ORAL | Status: DC | PRN

## 2013-07-03 MED ORDER — ASPIRIN 325 MG PO TBEC: 325.0000 mg | DELAYED_RELEASE_TABLET | Freq: Two times a day (BID) | ORAL | Status: AC

## 2013-07-03 MED ORDER — METHOCARBAMOL 500 MG PO TABS: 500.0000 mg | ORAL_TABLET | Freq: Four times a day (QID) | ORAL | Status: DC | PRN

## 2013-07-03 MED ORDER — DSS 100 MG PO CAPS: 100.0000 mg | ORAL_CAPSULE | Freq: Two times a day (BID) | ORAL | Status: DC

## 2013-07-03 MED ORDER — POTASSIUM CHLORIDE CRYS ER 20 MEQ PO TBCR
40.0000 meq | EXTENDED_RELEASE_TABLET | Freq: Once | ORAL | Status: AC
  Administered 2013-07-03: 40 meq via ORAL
  Filled 2013-07-03: qty 2

## 2013-07-03 MED ORDER — FERROUS SULFATE 325 (65 FE) MG PO TABS
325.0000 mg | ORAL_TABLET | Freq: Three times a day (TID) | ORAL | Status: DC
Start: 2013-07-03 — End: 2013-12-24

## 2013-07-03 NOTE — Evaluation (Signed)
Occupational Therapy Evaluation Patient Details Name: Justin PointsKiran K Gerbino MRN: [id number] DOB: 09/07/1955 Today's Date: 07/03/2013    History of Present Illness R DA THA   Clinical Impression   Pt was admitted for the above surgery.  All education was completed.  Pt moves quickly and needed multimodal cues at times as [place] is not primary language.  Visitor reiterated what I was saying.  Pt does not need further OT but he does need min guard when ambulating    Follow Up Recommendations  Supervision/Assistance - 24 hour    Equipment Recommendations  3 in 1 bedside comode    Recommendations for Other Services       Precautions / Restrictions Precautions Precautions: Fall Restrictions Weight Bearing Restrictions: No      Mobility Bed Mobility                  Transfers       Sit to Stand: Min guard         General transfer comment: cues for UE/LE position    Balance                                    ADL   Grooming: Supervision/safety;Wash/dry face;Wash/dry hands;Standing   Upper Body Dressing : Minimal assistance;Sitting Lower Body Bathing: Moderate assistance;Sit to/from stand Lower Body Dressing: Maximal assistance;Sit to/from stand Toilet Transfer: Min guard;BSC;Ambulation Toileting- ArchitectClothing Manipulation and Hygiene: Supervision/safety;Sit to/from stand Tub/ Shower Transfer: Min guard;Ambulation;Walk-in shower Functional mobility during ADLs: Min guard;Rolling walker General ADL Comments: 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  Also has family to assist.  Pt did not want to see sock aid.  He moves quickly and min cues given for safety as well as for UE/LE position for sit to stand     Vision                     Perception     Praxis      Pertinent Vitals/Pain Pain back of thigh/buttocks.  Not rated.  Repositioned and ice applied     Hand Dominance Right   Extremity/Trunk Assessment Upper  Extremity Assessment Upper Extremity Assessment: Overall WFL for tasks assessed           Communication Communication Communication: Prefers language other than English (speaks english (quickly). says yes at times I don't feel he understood what I said.  Visitor reiterated.  They spoke in primary language at times )   Cognition Arousal/Alertness: Awake/alert Behavior During Therapy: WFL for tasks assessed/performed Overall Cognitive Status: Within Functional Limits for tasks assessed                     General Comments       Exercises      Home Living Family/patient expects to be discharged to:: Private residence Living Arrangements: Spouse/significant other Available Help at Discharge: Family               Bathroom Shower/Tub: Walk-in Human resources officershower   Bathroom Toilet: Standard     Home Equipment: None          Prior Functioning/Environment Level of Independence: Independent             OT Diagnosis:     OT Problem List:     OT Treatment/Interventions:      OT Goals(Current goals can be found in the  care plan section) Acute Rehab OT Goals Patient Stated Goal: walk; decreased pain  OT Frequency:     Barriers to D/C:            End of Session:    Activity Tolerance: Patient tolerated treatment well Patient left: in chair;with call bell/phone within reach   Time: 0828-0854 OT Time Calculation (min): 26 min Charges:  OT General Charges $OT Visit: 1 Procedure OT Evaluation $Initial OT Evaluation Tier I: 1 Procedure OT Treatments $Self Care/Home Management : 8-22 mins G-Codes:    Blasa Raisch 2013/07/29, 9:11 AM   Marica Otter, OTR/L [phone] 2013-07-29

## 2013-07-03 NOTE — Progress Notes (Signed)
   Subjective: 1 Day Post-Op Procedure(s) (LRB): RIGHT TOTAL HIP ARTHROPLASTY ANTERIOR APPROACH (Right)   Patient reports pain as mild, pain controlled. No events throughout the night. Ready to be discharged home if he does well with PT and pain stays controlled.   Objective:   VITALS:   Filed Vitals:   07/03/13 0800  BP: 117/77  Pulse: 98  Temp: 98.4 F (36.9 C)  Resp: 13    Neurovascular intact Dorsiflexion/Plantar flexion intact Incision: dressing C/D/I No cellulitis present Compartment soft  LABS  Recent Labs  07/03/13 0415  HGB 12.0*  HCT 36.9*  WBC 8.9  PLT 169     Recent Labs  07/03/13 0415  NA 137  K 3.3*  BUN 10  CREATININE 0.71  GLUCOSE 149*     Assessment/Plan: 1 Day Post-Op Procedure(s) (LRB): RIGHT TOTAL HIP ARTHROPLASTY ANTERIOR APPROACH (Right) Foley cath d/c'ed Advance diet Up with therapy D/C IV fluids Discharge home with home health [facility]. Follow up with OLIN,Lauris Keepers D in 2 weeks.  Contact information:  [facility] [address], [address] [place] [id number] [phone]    Expected ABLA  Treated with iron and will observe  Overweight (BMI 25-29.9) Estimated body mass index is 25.09 kg/(m^2) as calculated from the following:   Height as of this encounter: 5\' 8"  (1.727 m).   Weight as of this encounter: 74.844 kg (165 lb). Patient also counseled that weight may inhibit the healing process Patient counseled that losing weight will help with future health issues  Hypokalemia Treated with oral potassium and will observe    Anastasio AuerbachMatthew S. Jasaiah Karwowski   PAC  07/03/2013, 9:01 AM

## 2013-07-03 NOTE — Progress Notes (Signed)
UR completed. Yareni Creps RN CCM Case Mgmt phone [phone] 

## 2013-07-03 NOTE — Progress Notes (Signed)
Physical Therapy Treatment Patient Details Name: Justin PointsKiran K [name] MRN: [id number] DOB: 11/28/1955 Today's Date: 07/03/2013    History of Present Illness R DA THA    PT Comments    Wife and multiple family present. Pt does demonstrate very weak R quads with attempts for LAQ, is able to activate quad with QS but not holding. Pt/family cautioned to be careful and always have RW with him.   Follow Up Recommendations  Home health PT     Equipment Recommendations  None recommended by PT    Recommendations for Other Services       Precautions / Restrictions Precautions Precautions: Fall Precaution Comments: R knee extension very weak. encouraged pt/family to take caution on stairs.    Mobility  Bed Mobility                  Transfers Overall transfer level: Needs assistance Equipment used: Rolling walker (2 wheeled) Transfers: Sit to/from Stand Sit to Stand: Supervision         General transfer comment: cues for UE/LE position  Ambulation/Gait Ambulation/Gait assistance: Min guard Ambulation Distance (Feet): 50 Feet Assistive device: Rolling walker (2 wheeled) Gait Pattern/deviations: Step-to pattern;Antalgic     General Gait Details: sequnce cues   Stairs Stairs: Yes Stairs assistance: Min assist Stair Management: One rail Left;Step to pattern;Forwards;With crutches Number of Stairs: 4 General stair comments: multiple family members present and assisted in stair training. Instructed them that someone needs to be behind going up and in front going down  Wheelchair Mobility    Modified Rankin (Stroke Patients Only)       Balance                                    Cognition Arousal/Alertness: Awake/alert                          Exercises Total Joint Exercises Quad Sets: AROM;Right;10 reps;Supine Short Arc Quad: AAROM;Right;10 reps;Seated Heel Slides: AAROM;Right;10 reps;Seated    General Comments        Pertinent  Vitals/Pain R hip/buttock area.    Home Living                      Prior Function            PT Goals (current goals can now be found in the care plan section) Progress towards PT goals: Progressing toward goals    Frequency  7X/week    PT Plan Current plan remains appropriate    End of Session   Activity Tolerance: Patient tolerated treatment well Patient left: in chair;with call bell/phone within reach;with family/visitor present     Time: [id number] PT Time Calculation (min): 22 min  Charges:  $Gait Training: 8-22 mins                    G Codes:      Justin [name], Justin [name] 07/03/2013, 3:05 PM

## 2013-07-05 NOTE — Discharge Summary (Signed)
Physician Discharge Summary  Patient ID: Justin [name] MRN: [id number] DOB/AGE: Feb 23, 1956 58 y.o.  Admit date: 07/02/2013 Discharge date: 07/03/2013   Procedures:  Procedure(s) (LRB): RIGHT TOTAL HIP ARTHROPLASTY ANTERIOR APPROACH (Right)  Attending Physician:  Dr. Durene Romans   Admission Diagnoses:   Right hip OA / pain  Discharge Diagnoses:  Principal Problem:   S/P right THA, AA Active Problems:   S/P total hip arthroplasty   Expected blood loss anemia   Overweight (BMI 25.0-29.9)   Hypokalemia  Past Medical History  Diagnosis Date  . Allergic rhinitis   . GERD (gastroesophageal reflux disease)   . Hyperlipidemia   . HTN (hypertension)   . Seasonal allergies   . Arthritis   . Elevated uric acid in blood   . Discoloration of skin     L LEG  . History of stomach ulcers   . Diabetes mellitus without complication     HPI: Justin [name], 58 y.o. male, has a history of pain and functional disability in the right hip(s) due to arthritis and patient has failed non-surgical conservative treatments for greater than 12 weeks to include NSAID's and/or analgesics, corticosteriod injections and activity modification. Onset of symptoms was gradual starting >10 years ago with gradually worsening course since that time.The patient noted no past surgery on the right hip(s). Patient currently rates pain in the right hip at 8 out of 10 with activity. Patient has worsening of pain with activity and weight bearing, trendelenberg gait, pain that interfers with activities of daily living and pain with passive range of motion. Patient has evidence of periarticular osteophytes and joint space narrowing by imaging studies. This condition presents safety issues increasing the risk of falls. There is no current active infection. Risks, benefits and expectations were discussed with the patient. Risks including but not limited to the risk of anesthesia, blood clots, nerve damage, blood vessel damage,  failure of the prosthesis, infection and up to and including death. Patient understand the risks, benefits and expectations and wishes to proceed with surgery.  PCP: Rogelia Boga, MD   Discharged Condition: good  Hospital Course:  Patient underwent the above stated procedure on 07/02/2013. Patient tolerated the procedure well and brought to the recovery room in good condition and subsequently to the floor.  POD #1 BP: 117/77 ; Pulse: 98 ; Temp: 98.4 F (36.9 C) ; Resp: 13  Patient reports pain as mild, pain controlled. No events throughout the night. Ready to be discharged home.  Neurovascular intact, dorsiflexion/plantar flexion intact, incision: dressing C/D/I, no cellulitis present and compartment soft.   LABS  Basename    HGB  12.0  HCT  36.9    Discharge Exam: General appearance: alert, cooperative and no distress Extremities: Homans sign is negative, no sign of DVT, no edema, redness or tenderness in the calves or thighs and no ulcers, gangrene or trophic changes  Disposition:    Home-Health Care Svc with follow up in 2 weeks   Follow-up Information   Follow up with [facility])    Contact information:   [address] [place] [address] [phone]       Follow up with Shelda Pal, MD. Schedule an appointment as soon as possible for a visit in 2 weeks.   Specialty:  Orthopedic Surgery   Contact information:   [address] [place] [address] [phone]       Discharge Orders   Future Orders  Complete By Expires   Call MD / Call 911  As directed    Comments:     If you experience chest pain or shortness of breath, CALL 911 and be transported to the hospital emergency room.  If you develope a fever above 101 F, pus (white drainage) or increased drainage or redness at the wound, or calf pain, call your surgeon's office.   Change dressing  As directed    Comments:     Maintain  surgical dressing for 10-14 days, or until follow up in the clinic.   Constipation Prevention  As directed    Comments:     Drink plenty of fluids.  Prune juice may be helpful.  You may use a stool softener, such as Colace (over the counter) 100 mg twice a day.  Use MiraLax (over the counter) for constipation as needed.   Diet - low sodium heart healthy  As directed    Discharge instructions  As directed    Comments:     Maintain surgical dressing for 10-14 days, or until follow up in the clinic. Follow up in 2 weeks at [facility]. Call with any questions or concerns.   Increase activity slowly as tolerated  As directed    TED hose  As directed    Comments:     Use stockings (TED hose) for 2 weeks on both leg(s).  You may remove them at night for sleeping.   Weight bearing as tolerated  As directed    Questions:     Laterality:     Extremity:          Medication List         ADVAIR DISKUS 500-50 MCG/DOSE Aepb  Generic drug:  Fluticasone-Salmeterol  Inhale 1 puff into the lungs every 12 (twelve) hours as needed. For shortness of breath     aspirin 325 MG EC tablet  Take 1 tablet (325 mg total) by mouth 2 (two) times daily.     DSS 100 MG Caps  Take 100 mg by mouth 2 (two) times daily.     esomeprazole 40 MG capsule  Commonly known as:  NEXIUM  Take 40 mg by mouth daily before breakfast.     ferrous sulfate 325 (65 FE) MG tablet  Take 1 tablet (325 mg total) by mouth 3 (three) times daily after meals.     HYDROcodone-acetaminophen 7.5-325 MG per tablet  Commonly known as:  NORCO  Take 1-2 tablets by mouth every 4 (four) hours as needed for moderate pain.     metFORMIN 500 MG 24 hr tablet  Commonly known as:  GLUCOPHAGE-XR  Take 500 mg by mouth daily with breakfast.     methocarbamol 500 MG tablet  Commonly known as:  ROBAXIN  Take 1 tablet (500 mg total) by mouth every 6 (six) hours as needed for muscle spasms.     metoprolol succinate 50 MG 24 hr tablet   Commonly known as:  TOPROL-XL  Take 100 mg by mouth every morning.     polyethylene glycol packet  Commonly known as:  MIRALAX / GLYCOLAX  Take 17 g by mouth daily as needed for mild constipation.     simvastatin 40 MG tablet  Commonly known as:  ZOCOR  Take 40 mg by mouth at bedtime.     vitamin B-12 1000 MCG tablet  Commonly known as:  CYANOCOBALAMIN  Take 1,000 mcg by mouth daily.         Signed: Molli Hazard  S. Emrys Mceachron   PAC  07/05/2013, 7:44 AM

## 2013-09-24 ENCOUNTER — Institutional Professional Consult (permissible substitution): Payer: BC Managed Care – PPO | Admitting: Internal Medicine

## 2013-12-24 ENCOUNTER — Encounter: Payer: Self-pay | Admitting: Internal Medicine

## 2013-12-24 ENCOUNTER — Encounter (INDEPENDENT_AMBULATORY_CARE_PROVIDER_SITE_OTHER): Payer: Self-pay

## 2013-12-24 ENCOUNTER — Ambulatory Visit (INDEPENDENT_AMBULATORY_CARE_PROVIDER_SITE_OTHER): Payer: BC Managed Care – PPO | Admitting: Internal Medicine

## 2013-12-24 ENCOUNTER — Institutional Professional Consult (permissible substitution): Payer: BC Managed Care – PPO | Admitting: Pulmonary Disease

## 2013-12-24 VITALS — BP 112/80 | HR 98 | Ht 66.0 in | Wt 169.0 lb

## 2013-12-24 DIAGNOSIS — Z9989 Dependence on other enabling machines and devices: Secondary | ICD-10-CM

## 2013-12-24 DIAGNOSIS — G4733 Obstructive sleep apnea (adult) (pediatric): Secondary | ICD-10-CM

## 2013-12-24 NOTE — Assessment & Plan Note (Signed)
Severe obstructive sleep apnea with increased some degree of daytime sleepiness. The medical concerns, physiology of sleep apnea, responsibility to drive safely and importance of weight control were reviewed. Basic sleep hygiene reviewed. Because of his high score, I think he is likely to do better with CPAP. I left the door open for him to explore on oral appliance later. Plan-CPAP with autotitration for pressure check

## 2013-12-24 NOTE — Progress Notes (Signed)
12/24/13- 58 yo never smoker FOLLOWS FOR: Pt referred courtesy of Dr. Anselm Jungling for sleep apnea. Epworth=13 NPSG - 02/14/12- severe OSA, AHI  77.4/ hr, weight 160 lbs. After sleep study in 2013, done for snoring, hypertension and daytime tiredness, he wore CPAP for one month. He says nobody ever explained anything. He turned his machine back in when he went home to [place] for 6 months. Now admits daytime tiredness if sitting quietly. Bedtime 11:30 PM, sleep latency 10-15 minutes, awake 3 or 4 times before up at 7:30 AM. History of septoplasty, seasonal allergic rhinitis/ Dr. [place] Callas, HBP/ Dr Jacinto Halim. Father snored, died of MI.  Prior to Admission medications   Medication Sig Start Date End Date Taking? Authorizing Provider  docusate sodium 100 MG CAPS Take 100 mg by mouth 2 (two) times daily. 07/03/13  Yes Genelle Gather Babish, PA-C  esomeprazole (NEXIUM) 40 MG capsule Take 40 mg by mouth daily as needed.    Yes Historical Provider, MD  Fluticasone-Salmeterol (ADVAIR DISKUS) 500-50 MCG/DOSE AEPB Inhale 1 puff into the lungs every 12 (twelve) hours as needed. For shortness of breath   Yes Historical Provider, MD  losartan (COZAAR) 100 MG tablet Take 100 mg by mouth daily.   Yes Historical Provider, MD  simvastatin (ZOCOR) 40 MG tablet Take 40 mg by mouth at bedtime.   Yes Historical Provider, MD   Past Medical History  Diagnosis Date  . Allergic rhinitis   . GERD (gastroesophageal reflux disease)   . Hyperlipidemia   . HTN (hypertension)   . Seasonal allergies   . Arthritis   . Elevated uric acid in blood   . Discoloration of skin     L LEG  . History of stomach ulcers   . Diabetes mellitus without complication    Past Surgical History  Procedure Laterality Date  . Esophagogastroduodenoscopy  05-2008  . Colonoscopy  05-2008  . Total hip arthroplasty Right 07/02/2013    Procedure: RIGHT TOTAL HIP ARTHROPLASTY ANTERIOR APPROACH;  Surgeon: Shelda Pal, MD;  Location: [facility];  Service: Orthopedics;   Laterality: Right;   Family History  Problem Relation Age of Onset  . Heart attack Father 61  . Hypertension Mother    History   Social History  . Marital Status: Married    Spouse Name: N/A    Number of Children: N/A  . Years of Education: N/A   Occupational History  . self employed    Social History Main Topics  . Smoking status: Never Smoker   . Smokeless tobacco: Never Used  . Alcohol Use: Yes     Comment: RARE  . Drug Use: No  . Sexual Activity: Not on file   Other Topics Concern  . Not on file   Social History Narrative  . No narrative on file   ROS-see HPI Constitutional:   No-   weight loss, night sweats, fevers, chills, +fatigue, lassitude. HEENT:   No-  headaches, difficulty swallowing, tooth/dental problems, sore throat,       No-  sneezing, itching, ear ache, nasal congestion, post nasal drip,  CV:  No-   chest pain, orthopnea, PND, swelling in lower extremities, anasarca,                                  dizziness, palpitations Resp: No-   shortness of breath with exertion or at rest.  No-   productive cough,  No non-productive cough,  No- coughing up of blood.              No-   change in color of mucus.  No- wheezing.   Skin: No-   rash or lesions. GI:  No-   heartburn, indigestion, abdominal pain, nausea, vomiting, diarrhea,                 change in bowel habits, loss of appetite GU: No-   dysuria, change in color of urine, no urgency or frequency.  No- flank pain. MS:  No-   joint pain or swelling.  No- decreased range of motion.  No- back pain. Neuro-     nothing unusual Psych:  No- change in mood or affect. No depression or anxiety.  No memory loss.  OBJ- Physical Exam +Indian/ Asian, medium build General- Alert, Oriented, Affect-appropriate, Distress- none acute Skin- rash-none, lesions- none, excoriation- none Lymphadenopathy- none Head- atraumatic            Eyes- Gross vision intact, PERRLA, conjunctivae and secretions clear             Ears- Hearing, canals-normal            Nose- Clear, no-Septal dev, mucus, polyps, erosion, perforation             Throat- Mallampati III-IV , mucosa clear , drainage- none, tonsils- atrophic Neck- flexible , trachea midline, no stridor , thyroid nl, carotid no bruit Chest - symmetrical excursion , unlabored           Heart/CV- RRR , no murmur , no gallop  , no rub, nl s1 s2                           - JVD- none , edema- none, stasis changes- none, varices- none           Lung- clear to P&A, wheeze- none, cough- none , dullness-none, rub- none           Chest wall-  Abd- tender-no, distended-no, bowel sounds-present, HSM- no Br/ Gen/ Rectal- Not done, not indicated Extrem- cyanosis- none, clubbing, none, atrophy- none, strength- nl Neuro- grossly intact to observation

## 2013-12-24 NOTE — Patient Instructions (Signed)
Order- New DME New CPAP auto 5-20 for pressure recommendation, mask of choice, humidifier, supplies     Dx OSA  Please call as needed

## 2013-12-25 ENCOUNTER — Institutional Professional Consult (permissible substitution): Payer: BC Managed Care – PPO | Admitting: Pulmonary Disease

## 2014-02-12 ENCOUNTER — Ambulatory Visit: Payer: BC Managed Care – PPO | Admitting: Internal Medicine

## 2015-07-21 ENCOUNTER — Ambulatory Visit
Admission: RE | Admit: 2015-07-21 | Discharge: 2015-07-21 | Disposition: A | Discharge status: Home / Self Care | Payer: 59 | Source: Ambulatory Visit | Attending: Internal Medicine | Admitting: Internal Medicine

## 2015-07-21 ENCOUNTER — Other Ambulatory Visit: Payer: Self-pay | Admitting: Internal Medicine

## 2015-07-21 DIAGNOSIS — R05 Cough: Secondary | ICD-10-CM

## 2015-07-21 DIAGNOSIS — R059 Cough, unspecified: Secondary | ICD-10-CM

## 2015-09-28 ENCOUNTER — Ambulatory Visit: Payer: 59 | Admitting: Gastroenterology

## 2017-03-09 ENCOUNTER — Other Ambulatory Visit: Payer: Self-pay | Admitting: Gastroenterology

## 2017-03-09 DIAGNOSIS — R112 Nausea with vomiting, unspecified: Secondary | ICD-10-CM

## 2018-01-11 DIAGNOSIS — E1165 Type 2 diabetes mellitus with hyperglycemia: Secondary | ICD-10-CM | POA: Diagnosis not present

## 2018-01-11 DIAGNOSIS — I10 Essential (primary) hypertension: Secondary | ICD-10-CM | POA: Diagnosis not present

## 2018-01-11 DIAGNOSIS — E559 Vitamin D deficiency, unspecified: Secondary | ICD-10-CM | POA: Diagnosis not present

## 2018-01-11 DIAGNOSIS — E7849 Other hyperlipidemia: Secondary | ICD-10-CM | POA: Diagnosis not present

## 2018-06-09 ENCOUNTER — Encounter: Payer: Self-pay | Admitting: Gastroenterology

## 2018-06-27 DIAGNOSIS — M25572 Pain in left ankle and joints of left foot: Secondary | ICD-10-CM | POA: Diagnosis not present

## 2018-06-27 DIAGNOSIS — M25571 Pain in right ankle and joints of right foot: Secondary | ICD-10-CM | POA: Diagnosis not present

## 2018-06-27 DIAGNOSIS — M7742 Metatarsalgia, left foot: Secondary | ICD-10-CM | POA: Diagnosis not present

## 2018-06-27 DIAGNOSIS — M7741 Metatarsalgia, right foot: Secondary | ICD-10-CM | POA: Diagnosis not present

## 2018-07-23 DIAGNOSIS — H1045 Other chronic allergic conjunctivitis: Secondary | ICD-10-CM | POA: Diagnosis not present

## 2018-07-23 DIAGNOSIS — J4540 Moderate persistent asthma, uncomplicated: Secondary | ICD-10-CM | POA: Diagnosis not present

## 2018-07-23 DIAGNOSIS — J3089 Other allergic rhinitis: Secondary | ICD-10-CM | POA: Diagnosis not present

## 2018-07-23 DIAGNOSIS — J301 Allergic rhinitis due to pollen: Secondary | ICD-10-CM | POA: Diagnosis not present

## 2018-07-25 ENCOUNTER — Other Ambulatory Visit: Payer: Self-pay

## 2018-07-25 MED ORDER — SIMVASTATIN 40 MG PO TABS: 40.0000 mg | ORAL_TABLET | Freq: Every day | ORAL | 0 refills | Status: DC

## 2018-07-26 ENCOUNTER — Other Ambulatory Visit: Payer: Self-pay

## 2018-07-26 MED ORDER — LOSARTAN POTASSIUM 100 MG PO TABS: 100.0000 mg | ORAL_TABLET | Freq: Every day | ORAL | 1 refills | Status: DC

## 2018-07-26 MED ORDER — SIMVASTATIN 40 MG PO TABS: 40.0000 mg | ORAL_TABLET | Freq: Every day | ORAL | 1 refills | Status: DC

## 2018-07-27 ENCOUNTER — Encounter: Payer: Self-pay | Admitting: Cardiology

## 2018-07-30 DIAGNOSIS — Z8249 Family history of ischemic heart disease and other diseases of the circulatory system: Secondary | ICD-10-CM | POA: Insufficient documentation

## 2018-07-30 NOTE — Progress Notes (Signed)
Primary Physician/Referring:  Raelyn Number, MD  Patient ID: Justin [name], male    DOB: 1955-12-16, 63 y.o.   MRN: [id number]  Chief Complaint  Patient presents with  . Hypertension    2-3 CHEST PAIN  . Chest Pain    HPI: Justin [name]  is a 63 y.o. male  with hypertension, GERD, family history of early CAD, hyperglycemia and hyperlipidemia.  Patient was last seen in July 2018 for atypical chest pain. He underwent treadmill stress testing in May 2018 that was without EKG changes.   Described as dull pain in the chest however he has continued to do his physical activity and exercise on a regular basis, with the past 2 months, he has noticed chest discomfort again on and off.  Described as tightness and heaviness in the middle of the chest and sometimes feels like in the upper part of the chest.  He had not discontinued his exercise program due to this, most of the episodes occurristly after he has finished doing his work and he is resting.No shortness breath, no palpitations, no dizziness or syncope,  No leg edema. He denies any headaches, visual disturbances, symptoms to suggest TIA or claudication. No recent weight changes.   Past Medical History:  Diagnosis Date  . Allergic rhinitis   . Arthritis   . Diabetes mellitus without complication ([place])   . Discoloration of skin    L LEG  . Elevated uric acid in blood   . GERD (gastroesophageal reflux disease)   . History of stomach ulcers   . HTN (hypertension)   . Hyperlipidemia   . Seasonal allergies     Past Surgical History:  Procedure Laterality Date  . COLONOSCOPY  05-2008  . ESOPHAGOGASTRODUODENOSCOPY  05-2008  . TOTAL HIP ARTHROPLASTY Right 07/02/2013   Procedure: RIGHT TOTAL HIP ARTHROPLASTY ANTERIOR APPROACH;  Surgeon: Mauri Pole, MD;  Location: [facility];  Service: Orthopedics;  Laterality: Right;    Social History   Socioeconomic History  . Marital status: Married    Spouse name: Not on file  . Number of  children: 2  . Years of education: Not on file  . Highest education level: Not on file  Occupational History  . Occupation: self employed  Social Needs  . Financial resource strain: Not on file  . Food insecurity:    Worry: Not on file    Inability: Not on file  . Transportation needs:    Medical: Not on file    Non-medical: Not on file  Tobacco Use  . Smoking status: Never Smoker  . Smokeless tobacco: Never Used  Substance and Sexual Activity  . Alcohol use: Yes    Comment: RARE  . Drug use: No  . Sexual activity: Not on file  Lifestyle  . Physical activity:    Days per week: Not on file    Minutes per session: Not on file  . Stress: Not on file  Relationships  . Social connections:    Talks on phone: Not on file    Gets together: Not on file    Attends religious service: Not on file    Active member of club or organization: Not on file    Attends meetings of clubs or organizations: Not on file    Relationship status: Not on file  . Intimate partner violence:    Fear of current or ex partner: Not on file    Emotionally abused: Not on file    Physically  abused: Not on file    Forced sexual activity: Not on file  Other Topics Concern  . Not on file  Social History Narrative  . Not on file    Current Outpatient Medications on File Prior to Visit  Medication Sig Dispense Refill  . aspirin EC 81 MG tablet Take 81 mg by mouth daily.    . Cholecalciferol (VITAMIN D-3) 125 MCG (5000 UT) TABS Take by mouth.    . esomeprazole (NEXIUM) 40 MG capsule Take 40 mg by mouth daily as needed.     Marland Kitchen losartan (COZAAR) 100 MG tablet Take 1 tablet (100 mg total) by mouth daily. 90 tablet 1  . simvastatin (ZOCOR) 40 MG tablet Take 1 tablet (40 mg total) by mouth at bedtime. 90 tablet 1  . vitamin B-12 (CYANOCOBALAMIN) 1000 MCG tablet Take 1,000 mcg by mouth daily.     No current facility-administered medications on file prior to visit.     Review of Systems  Constitution: Negative  for chills, decreased appetite, malaise/fatigue and weight gain.  Cardiovascular: Positive for chest pain. Negative for dyspnea on exertion, leg swelling and syncope.  Endocrine: Negative for cold intolerance.  Hematologic/Lymphatic: Does not bruise/bleed easily.  Musculoskeletal: Positive for arthritis and joint pain. Negative for joint swelling.  Gastrointestinal: Negative for abdominal pain, anorexia and change in bowel habit.  Neurological: Negative for headaches and light-headedness.  Psychiatric/Behavioral: Negative for depression and substance abuse.  All other systems reviewed and are negative.     Objective:  Blood pressure 119/83, pulse 90, height 5' 7" (1.702 m), weight 160 lb (72.6 kg). Body mass index is 25.06 kg/m. Limited exam due to virtual visit Physical Exam  Constitutional: He is oriented to person, place, and time. He appears well-developed and well-nourished. No distress.  Pulmonary/Chest: Effort normal.  Abdominal: Soft. Bowel sounds are normal.  Musculoskeletal: Normal range of motion.  Neurological: He is alert and oriented to person, place, and time.  Skin: Skin is dry.  Vitals reviewed.  Radiology: No results found. Laboratory Examination:   Labs 05/06/2016: Total cholesterol 139, triglycerides 84, HDL 42, LDL 80.  CBC normal, A1c 6.4%.  BUN 9, creatinine 0.71, CMP otherwise normal.  TSH normal.HEMOGLOBIN A1C Lab Results  Component Value Date   HGBA1C 5.8 02/28/2008   TSH No results for input(s): TSH in the last 8760 hours.  Cardiac studies:   Treadmill exercise stress test 08/22/2016: Indication:CP Resting EKG demonstrates NSR. The patient exercised according to Bruce Protocol, Total time recorded  6:32 min achieving max heart rate of  145 which was  91% of THR for age and  7.82 METS of work.  Stress terminated due to THR (>85% MPHR)/MPHR met. Normal BP response. There was no ST-T changes of ischemia with exercise stress test. There were no  significant arrhythmias.  Normal BP response. Rec: No e/o ischemia by GXT. Exercise tolerence is fatigue and achieving 91% of MPHR .  Continue Preventive therapy.   Echocardiogram 08/30/2016: Patient developed what appears to be atrial tachycardia @ 163 bpm for a brief moment during exam. Left ventricle cavity is normal in size. Mild concentric hypertrophy of the left ventricle. Normal global wall motion. Doppler evidence of grade I (impaired) diastolic dysfunction. Calculated EF 66%. Left atrial cavity is normal in size. The interatrial Septum is thin and mobile, but appears to be in tact by 2D and CF Doppler interrogation. Interatrial septum bulges to the right suggests elevated left atrial pressure. Mild tricuspid regurgitation. No evidence of pulmonary  hypertension.  Assessment:    Essential hypertension  Family history of early CAD:  Father deceased at age 90 from MI  Obstructive sleep apnea on CPAP  Hypercholesteremia  EKG 07/05/2016: Normal sinus rhythm at rate of 79 bpm, borderline left atrial enlargement, otherwise normal EKG. No significant change since EKG 09/22/2014.  Recommendations:    Patient continues to have recurrent episodes chest pain, post exertional chest pain suggest angina pectoris.  In view of his strong family history of premature coronary artery disease, hyperglycemia, hyperlipidemia, hypertension as risk factors, would recommend that we proceed with exercise nuclear stress test along with an echocardiogram.  He needs lab testing to be done, CBC, CMP, lipid profile testing along with LPA and TSH will be ordered prior to his next office visit with me.  For now advised him to take it easy, unless he is having worsening pain or rest pain, then he is advised to contact me or go to the emergency room, I did not make any changes to his medication.  Blood pressure has been well-controlled.  I do not have his recent labs.  I'll see him back to review his labs along with  test results.  His son is also present and is aware of the procedures and labs.  This was a virtual visit.  Adrian Prows, MD, [facility] 07/31/2018, 12:06 PM [facility] [place] Pager: [phone] Office: [phone] If no answer Cell [phone]

## 2018-07-31 ENCOUNTER — Encounter: Payer: Self-pay | Admitting: Cardiology

## 2018-07-31 ENCOUNTER — Ambulatory Visit: Payer: BLUE CROSS/BLUE SHIELD | Admitting: Cardiology

## 2018-07-31 ENCOUNTER — Other Ambulatory Visit: Payer: Self-pay

## 2018-07-31 VITALS — BP 119/83 | HR 90 | Ht 67.0 in | Wt 160.0 lb

## 2018-07-31 DIAGNOSIS — Z9989 Dependence on other enabling machines and devices: Secondary | ICD-10-CM

## 2018-07-31 DIAGNOSIS — R739 Hyperglycemia, unspecified: Secondary | ICD-10-CM

## 2018-07-31 DIAGNOSIS — G4733 Obstructive sleep apnea (adult) (pediatric): Secondary | ICD-10-CM

## 2018-07-31 DIAGNOSIS — E7800 Pure hypercholesterolemia, unspecified: Secondary | ICD-10-CM | POA: Diagnosis not present

## 2018-07-31 DIAGNOSIS — Z8249 Family history of ischemic heart disease and other diseases of the circulatory system: Secondary | ICD-10-CM

## 2018-07-31 DIAGNOSIS — I10 Essential (primary) hypertension: Secondary | ICD-10-CM | POA: Diagnosis not present

## 2018-07-31 DIAGNOSIS — R0789 Other chest pain: Secondary | ICD-10-CM | POA: Diagnosis not present

## 2018-08-06 DIAGNOSIS — J069 Acute upper respiratory infection, unspecified: Secondary | ICD-10-CM | POA: Diagnosis not present

## 2018-08-10 ENCOUNTER — Other Ambulatory Visit: Payer: Self-pay | Admitting: Internal Medicine

## 2018-08-10 DIAGNOSIS — R51 Headache: Secondary | ICD-10-CM | POA: Diagnosis not present

## 2018-08-10 DIAGNOSIS — R519 Headache, unspecified: Secondary | ICD-10-CM

## 2018-08-13 ENCOUNTER — Other Ambulatory Visit: Payer: BLUE CROSS/BLUE SHIELD

## 2018-08-15 DIAGNOSIS — R351 Nocturia: Secondary | ICD-10-CM | POA: Diagnosis not present

## 2018-08-15 DIAGNOSIS — R8271 Bacteriuria: Secondary | ICD-10-CM | POA: Diagnosis not present

## 2018-08-15 DIAGNOSIS — R350 Frequency of micturition: Secondary | ICD-10-CM | POA: Diagnosis not present

## 2018-08-15 DIAGNOSIS — N201 Calculus of ureter: Secondary | ICD-10-CM | POA: Diagnosis not present

## 2018-08-15 DIAGNOSIS — R3912 Poor urinary stream: Secondary | ICD-10-CM | POA: Diagnosis not present

## 2018-08-17 ENCOUNTER — Other Ambulatory Visit: Payer: 59

## 2018-08-22 ENCOUNTER — Other Ambulatory Visit: Payer: Self-pay

## 2018-08-22 ENCOUNTER — Ambulatory Visit
Admission: RE | Admit: 2018-08-22 | Discharge: 2018-08-22 | Disposition: A | Discharge status: Home / Self Care | Payer: 59 | Source: Ambulatory Visit | Attending: Internal Medicine | Admitting: Internal Medicine

## 2018-08-22 DIAGNOSIS — R519 Headache, unspecified: Secondary | ICD-10-CM

## 2018-08-22 DIAGNOSIS — R51 Headache: Secondary | ICD-10-CM | POA: Diagnosis not present

## 2018-08-23 DIAGNOSIS — R51 Headache: Secondary | ICD-10-CM | POA: Diagnosis not present

## 2018-08-23 DIAGNOSIS — K219 Gastro-esophageal reflux disease without esophagitis: Secondary | ICD-10-CM | POA: Diagnosis not present

## 2018-08-23 DIAGNOSIS — F419 Anxiety disorder, unspecified: Secondary | ICD-10-CM | POA: Diagnosis not present

## 2018-08-27 ENCOUNTER — Other Ambulatory Visit: Payer: BLUE CROSS/BLUE SHIELD

## 2018-08-27 ENCOUNTER — Telehealth: Payer: Self-pay

## 2018-08-27 NOTE — Telephone Encounter (Signed)
Pt called c/o bp being elevated 145/89 with a HA, feeling weak and he thinks it might be related to the losartan even though he has been on it for years

## 2018-08-27 NOTE — Telephone Encounter (Signed)
Add Metoprolol succinate 25 mg daily and he can take extra dose if SBP >130 mm Hg

## 2018-08-28 ENCOUNTER — Other Ambulatory Visit: Payer: Self-pay

## 2018-08-28 DIAGNOSIS — R351 Nocturia: Secondary | ICD-10-CM | POA: Diagnosis not present

## 2018-08-28 DIAGNOSIS — R3912 Poor urinary stream: Secondary | ICD-10-CM | POA: Diagnosis not present

## 2018-08-28 MED ORDER — METOPROLOL SUCCINATE ER 25 MG PO TB24: 25.0000 mg | ORAL_TABLET | Freq: Every day | ORAL | 3 refills | Status: DC

## 2018-08-28 NOTE — Telephone Encounter (Signed)
Can you send this in to [facility] please; Pt is already aware

## 2018-08-29 ENCOUNTER — Ambulatory Visit (INDEPENDENT_AMBULATORY_CARE_PROVIDER_SITE_OTHER): Payer: BLUE CROSS/BLUE SHIELD

## 2018-08-29 ENCOUNTER — Other Ambulatory Visit: Payer: Self-pay

## 2018-08-29 DIAGNOSIS — R0789 Other chest pain: Secondary | ICD-10-CM

## 2018-09-07 ENCOUNTER — Other Ambulatory Visit: Payer: BLUE CROSS/BLUE SHIELD

## 2018-09-12 ENCOUNTER — Ambulatory Visit: Payer: BLUE CROSS/BLUE SHIELD | Admitting: Cardiology

## 2018-09-20 DIAGNOSIS — H01002 Unspecified blepharitis right lower eyelid: Secondary | ICD-10-CM | POA: Diagnosis not present

## 2018-09-20 DIAGNOSIS — H01001 Unspecified blepharitis right upper eyelid: Secondary | ICD-10-CM | POA: Diagnosis not present

## 2018-09-20 DIAGNOSIS — H01005 Unspecified blepharitis left lower eyelid: Secondary | ICD-10-CM | POA: Diagnosis not present

## 2018-09-20 DIAGNOSIS — H01004 Unspecified blepharitis left upper eyelid: Secondary | ICD-10-CM | POA: Diagnosis not present

## 2018-09-21 DIAGNOSIS — J301 Allergic rhinitis due to pollen: Secondary | ICD-10-CM | POA: Diagnosis not present

## 2018-09-21 DIAGNOSIS — J3089 Other allergic rhinitis: Secondary | ICD-10-CM | POA: Diagnosis not present

## 2018-09-21 DIAGNOSIS — H1045 Other chronic allergic conjunctivitis: Secondary | ICD-10-CM | POA: Diagnosis not present

## 2018-09-21 DIAGNOSIS — J4540 Moderate persistent asthma, uncomplicated: Secondary | ICD-10-CM | POA: Diagnosis not present

## 2018-09-27 ENCOUNTER — Ambulatory Visit
Admission: RE | Admit: 2018-09-27 | Discharge: 2018-09-27 | Disposition: A | Discharge status: Home / Self Care | Payer: 59 | Source: Ambulatory Visit | Attending: Allergy | Admitting: Allergy

## 2018-09-27 ENCOUNTER — Other Ambulatory Visit: Payer: Self-pay | Admitting: Allergy

## 2018-09-27 DIAGNOSIS — J4540 Moderate persistent asthma, uncomplicated: Secondary | ICD-10-CM

## 2018-09-27 DIAGNOSIS — Z8709 Personal history of other diseases of the respiratory system: Secondary | ICD-10-CM | POA: Diagnosis not present

## 2019-01-03 DIAGNOSIS — Z8249 Family history of ischemic heart disease and other diseases of the circulatory system: Secondary | ICD-10-CM | POA: Diagnosis not present

## 2019-01-03 DIAGNOSIS — R739 Hyperglycemia, unspecified: Secondary | ICD-10-CM | POA: Diagnosis not present

## 2019-01-03 DIAGNOSIS — I10 Essential (primary) hypertension: Secondary | ICD-10-CM | POA: Diagnosis not present

## 2019-01-03 DIAGNOSIS — E7800 Pure hypercholesterolemia, unspecified: Secondary | ICD-10-CM | POA: Diagnosis not present

## 2019-01-04 LAB — CBC
Hematocrit: 43.3 % (ref 37.5–51.0)
Hemoglobin: 14.4 g/dL (ref 13.0–17.7)
MCH: 27 pg (ref 26.6–33.0)
MCHC: 33.3 g/dL (ref 31.5–35.7)
MCV: 81 fL (ref 79–97)
Platelets: 286 10*3/uL (ref 150–450)
RBC: 5.34 x10E6/uL (ref 4.14–5.80)
RDW: 11.7 % (ref 11.6–15.4)
WBC: 4.9 10*3/uL (ref 3.4–10.8)

## 2019-01-04 LAB — LIPID PANEL
Chol/HDL Ratio: 2.8 ratio (ref 0.0–5.0)
Cholesterol, Total: 104 mg/dL (ref 100–199)
HDL: 37 mg/dL — ABNORMAL LOW (ref >39)
LDL Chol Calc (NIH): 54 mg/dL (ref 0–99)
Triglycerides: 55 mg/dL (ref 0–149)
VLDL Cholesterol Cal: 13 mg/dL (ref 5–40)

## 2019-01-04 LAB — COMPREHENSIVE METABOLIC PANEL
ALT: 26 IU/L (ref 0–44)
AST: 25 IU/L (ref 0–40)
Albumin/Globulin Ratio: 1.8 (ref 1.2–2.2)
Albumin: 4.3 g/dL (ref 3.8–4.8)
Alkaline Phosphatase: 63 IU/L (ref 39–117)
BUN/Creatinine Ratio: 13 (ref 10–24)
BUN: 11 mg/dL (ref 8–27)
Bilirubin Total: 0.7 mg/dL (ref 0.0–1.2)
CO2: 24 mmol/L (ref 20–29)
Calcium: 9.4 mg/dL (ref 8.6–10.2)
Chloride: 106 mmol/L (ref 96–106)
Creatinine, Ser: 0.84 mg/dL (ref 0.76–1.27)
GFR calc Af Amer: 108 mL/min/{1.73_m2} (ref >59)
GFR calc non Af Amer: 93 mL/min/{1.73_m2} (ref >59)
Globulin, Total: 2.4 g/dL (ref 1.5–4.5)
Glucose: 114 mg/dL — ABNORMAL HIGH (ref 65–99)
Potassium: 4.4 mmol/L (ref 3.5–5.2)
Sodium: 143 mmol/L (ref 134–144)
Total Protein: 6.7 g/dL (ref 6.0–8.5)

## 2019-01-04 LAB — LIPOPROTEIN A (LPA): Lipoprotein (a): 8.8 nmol/L (ref <75.0)

## 2019-01-04 LAB — TSH: TSH: 1.58 u[IU]/mL (ref 0.450–4.500)

## 2019-01-04 LAB — HEMOGLOBIN A1C
Est. average glucose Bld gHb Est-mCnc: 131 mg/dL
Hgb A1c MFr Bld: 6.2 % — ABNORMAL HIGH (ref 4.8–5.6)

## 2019-01-07 ENCOUNTER — Other Ambulatory Visit: Payer: Self-pay

## 2019-01-07 ENCOUNTER — Ambulatory Visit (INDEPENDENT_AMBULATORY_CARE_PROVIDER_SITE_OTHER): Payer: BC Managed Care – PPO

## 2019-01-07 DIAGNOSIS — Z8249 Family history of ischemic heart disease and other diseases of the circulatory system: Secondary | ICD-10-CM | POA: Diagnosis not present

## 2019-01-07 DIAGNOSIS — R0789 Other chest pain: Secondary | ICD-10-CM | POA: Diagnosis not present

## 2019-01-07 DIAGNOSIS — E7800 Pure hypercholesterolemia, unspecified: Secondary | ICD-10-CM

## 2019-01-07 DIAGNOSIS — R739 Hyperglycemia, unspecified: Secondary | ICD-10-CM | POA: Diagnosis not present

## 2019-01-09 NOTE — Progress Notes (Signed)
Called pt to inform hm about his stress results. Pt would like for an appt more sooner than 10/08. Transferred to office desk to reschedule.

## 2019-01-17 ENCOUNTER — Ambulatory Visit: Payer: BLUE CROSS/BLUE SHIELD | Admitting: Cardiology

## 2019-01-17 NOTE — Progress Notes (Deleted)
 Primary Physician/Referring:  Hague, Imran P, MD  Patient ID: Justin [name], male    DOB: 01/01/1956, 63 y.o.   MRN: [id number]  No chief complaint on file.  HPI:    Peretz K Mallis  is a 63 y.o. Asian Indian male with hypertension, GERD, family history of early CAD, hyperglycemia and hyperlipidemia. Stress testing in May 2018 that was without EKG changes. Due to recurrent chest pain, underwent nuclear stress and also labs and now presents for f/u. ***  No shortness breath, no palpitations, no dizziness or syncope,  No leg edema. He denies any headaches, visual disturbances, symptoms to suggest TIA or claudication. No recent weight changes.   Past Medical History:  Diagnosis Date  . Allergic rhinitis   . Arthritis   . Diabetes mellitus without complication (HCC)   . Discoloration of skin    L LEG  . Elevated uric acid in blood   . GERD (gastroesophageal reflux disease)   . History of stomach ulcers   . HTN (hypertension)   . Hyperlipidemia   . Seasonal allergies    Past Surgical History:  Procedure Laterality Date  . COLONOSCOPY  05-2008  . ESOPHAGOGASTRODUODENOSCOPY  05-2008  . TOTAL HIP ARTHROPLASTY Right 07/02/2013   Procedure: RIGHT TOTAL HIP ARTHROPLASTY ANTERIOR APPROACH;  Surgeon: Matthew D Olin, MD;  Location: [facility];  Service: Orthopedics;  Laterality: Right;   Social History   Socioeconomic History  . Marital status: Married    Spouse name: Not on file  . Number of children: 2  . Years of education: Not on file  . Highest education level: Not on file  Occupational History  . Occupation: self employed  Social Needs  . Financial resource strain: Not on file  . Food insecurity    Worry: Not on file    Inability: Not on file  . Transportation needs    Medical: Not on file    Non-medical: Not on file  Tobacco Use  . Smoking status: Never Smoker  . Smokeless tobacco: Never Used  Substance and Sexual Activity  . Alcohol use: Yes    Comment: RARE  . Drug use: No   . Sexual activity: Not on file  Lifestyle  . Physical activity    Days per week: Not on file    Minutes per session: Not on file  . Stress: Not on file  Relationships  . Social connections    Talks on phone: Not on file    Gets together: Not on file    Attends religious service: Not on file    Active member of club or organization: Not on file    Attends meetings of clubs or organizations: Not on file    Relationship status: Not on file  . Intimate partner violence    Fear of current or ex partner: Not on file    Emotionally abused: Not on file    Physically abused: Not on file    Forced sexual activity: Not on file  Other Topics Concern  . Not on file  Social History Narrative  . Not on file   ROS  Review of Systems  Constitution: Negative for chills, decreased appetite, malaise/fatigue and weight gain.  Cardiovascular: Negative for dyspnea on exertion, leg swelling and syncope.  Endocrine: Negative for cold intolerance.  Hematologic/Lymphatic: Does not bruise/bleed easily.  Musculoskeletal: Negative for joint swelling.  Gastrointestinal: Negative for abdominal pain, anorexia, change in bowel habit, hematochezia and melena.  Neurological: Negative for headaches and   light-headedness.  Psychiatric/Behavioral: Negative for depression and substance abuse.  All other systems reviewed and are negative.  Objective   Vitals with BMI 07/31/2018 12/24/2013 07/03/2013  Height 5' 7" 5' 6" -  Weight 160 lbs 169 lbs -  BMI 54.98 26.4 -  Systolic 158 309 407  Diastolic 83 80 77  Pulse 90 98 98    There were no vitals taken for this visit. There is no height or weight on file to calculate BMI.   Physical Exam  Constitutional: He is oriented to person, place, and time. He appears well-developed and well-nourished. No distress.  HENT:  Head: Atraumatic.  Eyes: Conjunctivae are normal.  Neck: Neck supple. No JVD present. No thyromegaly present.  Cardiovascular: Normal rate, regular  rhythm, normal heart sounds, intact distal pulses and normal pulses. Exam reveals no gallop.  No murmur heard. No edema, no JVD  Pulmonary/Chest: Effort normal and breath sounds normal.  Abdominal: Soft. Bowel sounds are normal.  Musculoskeletal: Normal range of motion.  Neurological: He is alert and oriented to person, place, and time.  Skin: Skin is warm and dry.  Psychiatric: He has a normal mood and affect.  Vitals reviewed.  Radiology: No results found.  Laboratory examination:   Recent Labs    01/03/19 0904  NA 143  K 4.4  CL 106  CO2 24  GLUCOSE 114*  BUN 11  CREATININE 0.84  CALCIUM 9.4  GFRNONAA 93  GFRAA 108   CMP Latest Ref Rng & Units 01/03/2019 07/03/2013 06/27/2013  Glucose 65 - 99 mg/dL 114(H) 149(H) 151(H)  BUN 8 - 27 mg/dL _0 Creatinine 0.76 - 1.27 mg/dL 0.84 0.71 0.74  Sodium 134 - 144 mmol/L 143 137 140  Potassium 3.5 - 5.2 mmol/L 4.4 3.3(L) 3.8  Chloride 96 - 106 mmol/L 106 97 100  CO2 20 - 29 mmol/L _1 Calcium 8.6 - 10.2 mg/dL 9.4 8.5 9.8  Total Protein 6.0 - 8.5 g/dL 6.7 - -  Total Bilirubin 0.0 - 1.2 mg/dL 0.7 - -  Alkaline Phos 39 - 117 IU/L 63 - -  AST 0 - 40 IU/L 25 - -  ALT 0 - 44 IU/L 26 - -   CBC Latest Ref Rng & Units 01/03/2019 07/03/2013 06/27/2013  WBC 3.4 - 10.8 x10E3/uL 4.9 8.9 5.6  Hemoglobin 13.0 - 17.7 g/dL 14.4 12.0(L) 15.0  Hematocrit 37.5 - 51.0 % 43.3 36.9(L) 44.6  Platelets 150 - 450 x10E3/uL 286 169 226   Lipid Panel     Component Value Date/Time   CHOL 104 01/03/2019 0904   TRIG 55 01/03/2019 0904   HDL 37 (L) 01/03/2019 0904   CHOLHDL 2.8 01/03/2019 0904   LDLCALC 54 01/03/2019 0904   HEMOGLOBIN A1C Lab Results  Component Value Date   HGBA1C 6.2 (H) 01/03/2019   TSH Recent Labs    01/03/19 0903  TSH 1.580   Medications and allergies  No Known Allergies   Prior to Admission medications   Medication Sig Start Date End Date Taking? Authorizing Provider  aspirin EC 81 MG tablet Take 81 mg by  mouth daily.    [provider]  Cholecalciferol (VITAMIN D-3) 125 MCG (5000 UT) TABS Take by mouth.    [provider]  esomeprazole (NEXIUM) 40 MG capsule Take 40 mg by mouth daily as needed.     [provider]  losartan (COZAAR) 100 MG tablet Take 1 tablet (100 mg total) by mouth daily. 07/26/18  Adrian Prows, MD  metoprolol succinate (TOPROL-XL) 25 MG 24 hr tablet Take 1 tablet (25 mg total) by mouth daily. Take with or immediately following a meal. 08/28/18   Miquel Dunn, NP  simvastatin (ZOCOR) 40 MG tablet Take 1 tablet (40 mg total) by mouth at bedtime. 07/26/18   Adrian Prows, MD  vitamin B-12 (CYANOCOBALAMIN) 1000 MCG tablet Take 1,000 mcg by mouth daily.    [provider]     Current Outpatient Medications  Medication Instructions  . aspirin EC 81 mg, Oral, Daily  . Cholecalciferol (VITAMIN D-3) 125 MCG (5000 UT) TABS Oral  . esomeprazole (NEXIUM) 40 mg, Daily PRN  . losartan (COZAAR) 100 mg, Oral, Daily  . metoprolol succinate (TOPROL-XL) 25 mg, Oral, Daily, Take with or immediately following a meal.  . simvastatin (ZOCOR) 40 mg, Oral, Daily at bedtime  . vitamin B-12 (CYANOCOBALAMIN) 1,000 mcg, Oral, Daily    Cardiac Studies:   Treadmill exercise stress test 08/22/2016: Indication:CP Resting EKG demonstrates NSR. The patient exercised according to Bruce Protocol, Total time recorded  6:32 min achieving max heart rate of  145 which was  91% of THR for age and  7.82 METS of work.  Stress terminated due to THR (>85% MPHR)/MPHR met. Normal BP response. There was no ST-T changes of ischemia with exercise stress test. There were no significant arrhythmias.  Normal BP response. Rec: No e/o ischemia by GXT. Exercise tolerence is fatigue and achieving 91% of MPHR .  Continue Preventive therapy.   Lexiscan Sestamibi Stress Test 01/07/2019: Stress EKG is non-diagnostic, as this is pharmacological stress test. Myocardial perfusion imaging is  normal. Left ventricular ejection fraction is  75% with normal wall motion. Low risk study. No previous exam available for comparison.  Echocardiogram 08/29/2018: Normal LV systolic function with EF 62%. Left ventricle cavity is normal in size. Moderate concentric hypertrophy of the left ventricle. Normal global wall motion. Doppler evidence of grade I (impaired) diastolic dysfunction, normal LAP. Calculated EF 62%. Mild tricuspid regurgitation. Estimated pulmonary artery systolic pressure 29 mmHg. No significant change compared to previous study on 08/30/2016.  Assessment     ICD-10-CM   1. Atypical chest pain  R07.89   2. Essential hypertension  I10   3. Hyperglycemia  R73.9   4. Obstructive sleep apnea on CPAP  G47.33    Z99.89      EKG 07/05/2016: Normal sinus rhythm at rate of 79 bpm, borderline left atrial enlargement, otherwise normal EKG. No significant change since EKG 09/22/2014.  Recommendations:   ***  Adrian Prows, MD, [facility] 01/17/2019, 6:15 AM [facility]. [place] Pager: [phone] Office: [phone] If no answer Cell [phone]

## 2019-01-21 ENCOUNTER — Ambulatory Visit (INDEPENDENT_AMBULATORY_CARE_PROVIDER_SITE_OTHER): Payer: BC Managed Care – PPO | Admitting: Cardiology

## 2019-01-21 ENCOUNTER — Encounter: Payer: Self-pay | Admitting: Cardiology

## 2019-01-21 ENCOUNTER — Other Ambulatory Visit: Payer: Self-pay

## 2019-01-21 VITALS — BP 132/76 | HR 93 | Temp 98.0°F | Ht 67.0 in | Wt 155.8 lb

## 2019-01-21 DIAGNOSIS — R739 Hyperglycemia, unspecified: Secondary | ICD-10-CM | POA: Diagnosis not present

## 2019-01-21 DIAGNOSIS — K219 Gastro-esophageal reflux disease without esophagitis: Secondary | ICD-10-CM

## 2019-01-21 DIAGNOSIS — I10 Essential (primary) hypertension: Secondary | ICD-10-CM

## 2019-01-21 DIAGNOSIS — E7800 Pure hypercholesterolemia, unspecified: Secondary | ICD-10-CM | POA: Diagnosis not present

## 2019-01-21 DIAGNOSIS — R0789 Other chest pain: Secondary | ICD-10-CM | POA: Diagnosis not present

## 2019-01-21 MED ORDER — OMEPRAZOLE 20 MG PO CPDR: 20.0000 mg | DELAYED_RELEASE_CAPSULE | Freq: Every day | ORAL | 1 refills | Status: DC | PRN

## 2019-01-21 MED ORDER — SIMVASTATIN 40 MG PO TABS: 40.0000 mg | ORAL_TABLET | Freq: Every day | ORAL | 3 refills | Status: AC

## 2019-01-21 MED ORDER — METOPROLOL SUCCINATE ER 25 MG PO TB24: 25.0000 mg | ORAL_TABLET | Freq: Every day | ORAL | 3 refills | Status: DC

## 2019-01-21 MED ORDER — LOSARTAN POTASSIUM 100 MG PO TABS: 100.0000 mg | ORAL_TABLET | Freq: Every day | ORAL | 3 refills | Status: AC

## 2019-01-21 NOTE — Progress Notes (Signed)
Primary Physician/Referring:  Bonnita Nasuti, MD  Patient ID: Justin [name], male    DOB: 1956/02/22, 63 y.o.   MRN: [id number]  Chief Complaint  Patient presents with  . Chest Pain   HPI:    QUIN MATHENIA  is a 63 y.o. Asian [place] male with hypertension, GERD, family history of early CAD, hyperglycemia and hyperlipidemia. Stress testing in May 2018 that was without EKG changes. Due to recurrent chest pain, underwent nuclear stress and also labs and now presents for f/u.   He also c/o cough and bringing up sour cough due to acid reflux. Asking for any medications that can help. Denies heart burn. Has tried Nexium in past.   No shortness breath, no palpitations, no dizziness or syncope,  No leg edema. He denies any headaches, visual disturbances, symptoms to suggest TIA or claudication. No recent weight changes.   Past Medical History:  Diagnosis Date  . Allergic rhinitis   . Arthritis   . Diabetes mellitus without complication ([place])   . Discoloration of skin    L LEG  . Elevated uric acid in blood   . GERD (gastroesophageal reflux disease)   . History of stomach ulcers   . HTN (hypertension)   . Hyperlipidemia   . Seasonal allergies    Past Surgical History:  Procedure Laterality Date  . COLONOSCOPY  05-2008  . ESOPHAGOGASTRODUODENOSCOPY  05-2008  . TOTAL HIP ARTHROPLASTY Right 07/02/2013   Procedure: RIGHT TOTAL HIP ARTHROPLASTY ANTERIOR APPROACH;  Surgeon: Mauri Pole, MD;  Location: [facility];  Service: Orthopedics;  Laterality: Right;   Social History   Socioeconomic History  . Marital status: Married    Spouse name: Not on file  . Number of children: 2  . Years of education: Not on file  . Highest education level: Not on file  Occupational History  . Occupation: self employed  Social Needs  . Financial resource strain: Not on file  . Food insecurity    Worry: Not on file    Inability: Not on file  . Transportation needs    Medical: Not on file    Non-medical:  Not on file  Tobacco Use  . Smoking status: Never Smoker  . Smokeless tobacco: Never Used  Substance and Sexual Activity  . Alcohol use: Yes    Comment: RARE  . Drug use: No  . Sexual activity: Not on file  Lifestyle  . Physical activity    Days per week: Not on file    Minutes per session: Not on file  . Stress: Not on file  Relationships  . Social Herbalist on phone: Not on file    Gets together: Not on file    Attends religious service: Not on file    Active member of club or organization: Not on file    Attends meetings of clubs or organizations: Not on file    Relationship status: Not on file  . Intimate partner violence    Fear of current or ex partner: Not on file    Emotionally abused: Not on file    Physically abused: Not on file    Forced sexual activity: Not on file  Other Topics Concern  . Not on file  Social History Narrative  . Not on file   ROS  Review of Systems  Constitution: Negative for chills, decreased appetite, malaise/fatigue and weight gain.  Cardiovascular: Negative for dyspnea on exertion, leg swelling and syncope.  Endocrine:  Negative for cold intolerance.  Hematologic/Lymphatic: Does not bruise/bleed easily.  Musculoskeletal: Negative for joint swelling.  Gastrointestinal: Negative for abdominal pain, anorexia, change in bowel habit, hematochezia and melena.  Neurological: Negative for headaches and light-headedness.  Psychiatric/Behavioral: Negative for depression and substance abuse.  All other systems reviewed and are negative.  Objective   Vitals with BMI 01/21/2019 07/31/2018 12/24/2013  Height '5\' 7"'  '5\' 7"'  '5\' 6"'   Weight 155 lbs 13 oz 160 lbs 169 lbs  BMI 24.4 00.34 91.7  Systolic 915 056 979  Diastolic 76 83 80  Pulse 93 90 98    Blood pressure 132/76, pulse 93, temperature 98 F (36.7 C), height '5\' 7"'  (1.702 m), weight 155 lb 12.8 oz (70.7 kg), SpO2 96 %. Body mass index is 24.4 kg/m.   Physical Exam   Constitutional: He is oriented to person, place, and time. He appears well-developed and well-nourished. No distress.  HENT:  Head: Atraumatic.  Eyes: Conjunctivae are normal.  Neck: Neck supple. No JVD present. No thyromegaly present.  Cardiovascular: Normal rate, regular rhythm, normal heart sounds, intact distal pulses and normal pulses. Exam reveals no gallop.  No murmur heard. No edema, no JVD  Pulmonary/Chest: Effort normal and breath sounds normal.  Abdominal: Soft. Bowel sounds are normal.  Musculoskeletal: Normal range of motion.  Neurological: He is alert and oriented to person, place, and time.  Skin: Skin is warm and dry.  Psychiatric: He has a normal mood and affect.  Vitals reviewed.  Radiology: No results found.  Laboratory examination:   Recent Labs    01/03/19 0904  NA 143  K 4.4  CL 106  CO2 24  GLUCOSE 114*  BUN 11  CREATININE 0.84  CALCIUM 9.4  GFRNONAA 93  GFRAA 108   CMP Latest Ref Rng & Units 01/03/2019 07/03/2013 06/27/2013  Glucose 65 - 99 mg/dL 114(H) 149(H) 151(H)  BUN 8 - 27 mg/dL '11 10 8  ' Creatinine 0.76 - 1.27 mg/dL 0.84 0.71 0.74  Sodium 134 - 144 mmol/L 143 137 140  Potassium 3.5 - 5.2 mmol/L 4.4 3.3(L) 3.8  Chloride 96 - 106 mmol/L 106 97 100  CO2 20 - 29 mmol/L '24 29 29  ' Calcium 8.6 - 10.2 mg/dL 9.4 8.5 9.8  Total Protein 6.0 - 8.5 g/dL 6.7 - -  Total Bilirubin 0.0 - 1.2 mg/dL 0.7 - -  Alkaline Phos 39 - 117 IU/L 63 - -  AST 0 - 40 IU/L 25 - -  ALT 0 - 44 IU/L 26 - -   CBC Latest Ref Rng & Units 01/03/2019 07/03/2013 06/27/2013  WBC 3.4 - 10.8 x10E3/uL 4.9 8.9 5.6  Hemoglobin 13.0 - 17.7 g/dL 14.4 12.0(L) 15.0  Hematocrit 37.5 - 51.0 % 43.3 36.9(L) 44.6  Platelets 150 - 450 x10E3/uL 286 169 226   Lipid Panel     Component Value Date/Time   CHOL 104 01/03/2019 0904   TRIG 55 01/03/2019 0904   HDL 37 (L) 01/03/2019 0904   CHOLHDL 2.8 01/03/2019 0904   LDLCALC 54 01/03/2019 0904   HEMOGLOBIN A1C Lab Results  Component Value  Date   HGBA1C 6.2 (H) 01/03/2019   TSH Recent Labs    01/03/19 0903  TSH 1.580   Medications and allergies  No Known Allergies   Prior to Admission medications   Medication Sig Start Date End Date Taking? Authorizing Provider  aspirin EC 81 MG tablet Take 81 mg by mouth daily.    [provider]  Cholecalciferol (VITAMIN  D-3) 125 MCG (5000 UT) TABS Take by mouth.    [provider]  esomeprazole (NEXIUM) 40 MG capsule Take 40 mg by mouth daily as needed.     [provider]  losartan (COZAAR) 100 MG tablet Take 1 tablet (100 mg total) by mouth daily. 07/26/18   Adrian Prows, MD  metoprolol succinate (TOPROL-XL) 25 MG 24 hr tablet Take 1 tablet (25 mg total) by mouth daily. Take with or immediately following a meal. 08/28/18   Miquel Dunn, NP  simvastatin (ZOCOR) 40 MG tablet Take 1 tablet (40 mg total) by mouth at bedtime. 07/26/18   Adrian Prows, MD  vitamin B-12 (CYANOCOBALAMIN) 1000 MCG tablet Take 1,000 mcg by mouth daily.    [provider]     Current Outpatient Medications  Medication Instructions  . aspirin EC 81 mg, Oral, Daily  . Cholecalciferol (VITAMIN D-3) 125 MCG (5000 UT) TABS Oral, occasionally  . esomeprazole (NEXIUM) 40 mg, Daily PRN  . losartan (COZAAR) 100 mg, Oral, Daily  . metoprolol succinate (TOPROL-XL) 25 mg, Oral, Daily, Take with or immediately following a meal.  . omeprazole (PRILOSEC) 20 mg, Oral, Daily PRN  . simvastatin (ZOCOR) 40 mg, Oral, Daily at bedtime  . vitamin B-12 (CYANOCOBALAMIN) 1,000 mcg, Oral, occasionally    Cardiac Studies:   Treadmill exercise stress test 08/22/2016: Indication:CP Resting EKG demonstrates NSR. The patient exercised according to Bruce Protocol, Total time recorded  6:32 min achieving max heart rate of  145 which was  91% of THR for age and  7.82 METS of work.  Stress terminated due to THR (>85% MPHR)/MPHR met. Normal BP response. There was no ST-T changes of ischemia with  exercise stress test. There were no significant arrhythmias.  Normal BP response. Rec: No e/o ischemia by GXT. Exercise tolerence is fatigue and achieving 91% of MPHR .  Continue Preventive therapy.   Lexiscan Sestamibi Stress Test 01/07/2019: Stress EKG is non-diagnostic, as this is pharmacological stress test. Myocardial perfusion imaging is normal. Left ventricular ejection fraction is  75% with normal wall motion. Low risk study. No previous exam available for comparison.  Echocardiogram 08/29/2018: Normal LV systolic function with EF 62%. Left ventricle cavity is normal in size. Moderate concentric hypertrophy of the left ventricle. Normal global wall motion. Doppler evidence of grade I (impaired) diastolic dysfunction, normal LAP. Calculated EF 62%. Mild tricuspid regurgitation. Estimated pulmonary artery systolic pressure 29 mmHg. No significant change compared to previous study on 08/30/2016.  Assessment     ICD-10-CM   1. Atypical chest pain  R07.89 EKG 12-Lead  2. Essential hypertension  I10 losartan (COZAAR) 100 MG tablet    metoprolol succinate (TOPROL-XL) 25 MG 24 hr tablet  3. Hypercholesteremia  E78.00 simvastatin (ZOCOR) 40 MG tablet  4. Hyperglycemia  R73.9   5. Gastroesophageal reflux disease without esophagitis  K21.9 omeprazole (PRILOSEC) 20 MG capsule    EKG 01/21/2019: Normal sinus rhythm at rate of 86 bpm, left atrial enlargement, otherwise normal EKG.  No significant change from  EKG 07/05/2016   Recommendations:   Patient presents for follow-up and evaluation of atypical chest pain, I have reviewed the results of the stress test and echocardiogram to patient, symptoms are more indicative GERD and angina pectoris.  I will simply reassured him.  Advised him to use OTC omeprazole 20 daily basis for at least 2 weeks followed by p.r.n. use.  I reviewed his labs, lipids are under excellent control, blood pressure is also under excellent control.  Hence I reviewed and  refilled his prescriptions.  He does have hyperglycemia, low carbohydrate diet again discussed with the patient.  Healthy habits including regular exercise discuss, patient has been walking on the treadmill for at least 20-30 minutes on a daily basis.  He feels well, felt reassured, I'll see him back in one year.  Adrian Prows, MD, [facility] 01/21/2019, 3:19 PM [facility]. [place] Pager: [phone] Office: [phone] If no answer Cell [phone]

## 2019-02-20 DIAGNOSIS — K219 Gastro-esophageal reflux disease without esophagitis: Secondary | ICD-10-CM | POA: Diagnosis not present

## 2019-02-20 DIAGNOSIS — F1010 Alcohol abuse, uncomplicated: Secondary | ICD-10-CM | POA: Diagnosis not present

## 2019-02-20 DIAGNOSIS — K209 Esophagitis, unspecified without bleeding: Secondary | ICD-10-CM | POA: Diagnosis not present

## 2019-03-06 DIAGNOSIS — R1310 Dysphagia, unspecified: Secondary | ICD-10-CM | POA: Diagnosis not present

## 2019-03-06 DIAGNOSIS — K2100 Gastro-esophageal reflux disease with esophagitis, without bleeding: Secondary | ICD-10-CM | POA: Diagnosis not present

## 2019-03-06 DIAGNOSIS — R634 Abnormal weight loss: Secondary | ICD-10-CM | POA: Diagnosis not present

## 2019-04-15 DIAGNOSIS — Z1159 Encounter for screening for other viral diseases: Secondary | ICD-10-CM | POA: Diagnosis not present

## 2019-04-17 DIAGNOSIS — K2970 Gastritis, unspecified, without bleeding: Secondary | ICD-10-CM | POA: Diagnosis not present

## 2019-04-17 DIAGNOSIS — K2940 Chronic atrophic gastritis without bleeding: Secondary | ICD-10-CM | POA: Diagnosis not present

## 2019-04-17 DIAGNOSIS — K219 Gastro-esophageal reflux disease without esophagitis: Secondary | ICD-10-CM | POA: Diagnosis not present

## 2019-04-17 DIAGNOSIS — K222 Esophageal obstruction: Secondary | ICD-10-CM | POA: Diagnosis not present

## 2019-04-18 ENCOUNTER — Other Ambulatory Visit: Payer: Self-pay | Admitting: Gastroenterology

## 2019-04-18 DIAGNOSIS — R630 Anorexia: Secondary | ICD-10-CM

## 2019-04-18 DIAGNOSIS — R634 Abnormal weight loss: Secondary | ICD-10-CM

## 2019-04-29 DIAGNOSIS — R634 Abnormal weight loss: Secondary | ICD-10-CM | POA: Diagnosis not present

## 2019-04-30 ENCOUNTER — Other Ambulatory Visit: Payer: BC Managed Care – PPO

## 2019-05-23 ENCOUNTER — Ambulatory Visit: Payer: BC Managed Care – PPO | Admitting: Physician Assistant

## 2019-06-06 DIAGNOSIS — H26492 Other secondary cataract, left eye: Secondary | ICD-10-CM | POA: Diagnosis not present

## 2019-06-06 DIAGNOSIS — H0100A Unspecified blepharitis right eye, upper and lower eyelids: Secondary | ICD-10-CM | POA: Diagnosis not present

## 2019-06-06 DIAGNOSIS — H2511 Age-related nuclear cataract, right eye: Secondary | ICD-10-CM | POA: Diagnosis not present

## 2019-06-06 DIAGNOSIS — H40053 Ocular hypertension, bilateral: Secondary | ICD-10-CM | POA: Diagnosis not present

## 2019-06-06 DIAGNOSIS — H04123 Dry eye syndrome of bilateral lacrimal glands: Secondary | ICD-10-CM | POA: Diagnosis not present

## 2019-06-17 DIAGNOSIS — R109 Unspecified abdominal pain: Secondary | ICD-10-CM | POA: Diagnosis not present

## 2019-06-17 DIAGNOSIS — K219 Gastro-esophageal reflux disease without esophagitis: Secondary | ICD-10-CM | POA: Diagnosis not present

## 2019-06-21 DIAGNOSIS — R109 Unspecified abdominal pain: Secondary | ICD-10-CM | POA: Diagnosis not present

## 2019-06-24 DIAGNOSIS — R197 Diarrhea, unspecified: Secondary | ICD-10-CM | POA: Diagnosis not present

## 2019-06-26 DIAGNOSIS — I10 Essential (primary) hypertension: Secondary | ICD-10-CM | POA: Diagnosis not present

## 2019-06-26 DIAGNOSIS — Z125 Encounter for screening for malignant neoplasm of prostate: Secondary | ICD-10-CM | POA: Diagnosis not present

## 2019-06-26 DIAGNOSIS — R1084 Generalized abdominal pain: Secondary | ICD-10-CM | POA: Diagnosis not present

## 2019-06-26 DIAGNOSIS — E1165 Type 2 diabetes mellitus with hyperglycemia: Secondary | ICD-10-CM | POA: Diagnosis not present

## 2019-06-26 DIAGNOSIS — E559 Vitamin D deficiency, unspecified: Secondary | ICD-10-CM | POA: Diagnosis not present

## 2019-06-26 DIAGNOSIS — E782 Mixed hyperlipidemia: Secondary | ICD-10-CM | POA: Diagnosis not present

## 2019-06-28 DIAGNOSIS — D518 Other vitamin B12 deficiency anemias: Secondary | ICD-10-CM | POA: Diagnosis not present

## 2019-07-04 ENCOUNTER — Ambulatory Visit (INDEPENDENT_AMBULATORY_CARE_PROVIDER_SITE_OTHER): Payer: BC Managed Care – PPO | Admitting: Physician Assistant

## 2019-07-04 ENCOUNTER — Encounter: Payer: Self-pay | Admitting: Physician Assistant

## 2019-07-04 ENCOUNTER — Encounter: Payer: Self-pay | Admitting: Gastroenterology

## 2019-07-04 VITALS — BP 118/78 | HR 84 | Temp 97.2°F | Ht 67.0 in | Wt 141.4 lb

## 2019-07-04 DIAGNOSIS — R634 Abnormal weight loss: Secondary | ICD-10-CM | POA: Diagnosis not present

## 2019-07-04 DIAGNOSIS — R109 Unspecified abdominal pain: Secondary | ICD-10-CM | POA: Diagnosis not present

## 2019-07-04 DIAGNOSIS — R1013 Epigastric pain: Secondary | ICD-10-CM

## 2019-07-04 DIAGNOSIS — K5900 Constipation, unspecified: Secondary | ICD-10-CM

## 2019-07-04 DIAGNOSIS — Z01818 Encounter for other preprocedural examination: Secondary | ICD-10-CM

## 2019-07-04 MED ORDER — NA SULFATE-K SULFATE-MG SULF 17.5-3.13-1.6 GM/177ML PO SOLN: 1.0000 | Freq: Once | ORAL | 0 refills | Status: AC

## 2019-07-04 NOTE — Patient Instructions (Signed)
If you are age 64 or older, your body mass index should be between 23-30. Your Body mass index is 22.14 kg/m. If this is out of the aforementioned range listed, please consider follow up with your Primary Care Provider.  If you are age 32 or younger, your body mass index should be between 19-25. Your Body mass index is 22.14 kg/m. If this is out of the aformentioned range listed, please consider follow up with your Primary Care Provider.   You have been scheduled for a colonoscopy. Please follow written instructions given to you at your visit today.  Please pick up your prep supplies at the pharmacy within the next 1-3 days. If you use inhalers (even only as needed), please bring them with you on the day of your procedure.  Continue with taking your Dexilant 60 mg daily.  DO NOT take Carafate or Pantoprazole.  Follow up pending results of Colonoscopy.

## 2019-07-04 NOTE — Progress Notes (Addendum)
Chief Complaint: Abdominal pain, weight loss, weakness  HPI:    Justin [name] is a 64 year old [place] male with a past medical history as listed below, known to all the GI services in town, who was referred to me by Galvin Proffer, MD for a complaint of abdominal pain, weight loss and weakness.      04/17/2019 EGD at [facility] by Dr. Levora Angel showed gastritis and was otherwise normal.    04/29/2019 CT abdomen pelvis with contrast showed a 7 mm cyst in the superior pole of the left kidney and a small calcification of the base of the median umbilical ligament, otherwise unremarkable.    06/21/2019 CBC normal, hepatitis panel negative/normal, H. pylori IgM negative, PSA normal, age pylori IgG negative, IgA negative.  CMP normal.    06/26/2019 patient was seen by his PCP for stomach pain which started approximately 4 to 5 months ago but was worse ever since his Covid vaccine about a month ago.  He reported weight loss and feeling depressed.    Today, the patient presents to clinic and explains that he had a colonoscopy 3 to 4 years ago with Dr. Loreta Ave.  Tells me that he is jumping around between GI clinics because "no one can fit me in soon enough".  Tells me he is very anxious and worried in regards to this abdominal pain which continues per him and is not helped by "all the medicines they give me".  Tells me that currently he is taking Pantoprazole 40 mg daily, Carafate 1 g 4 times daily and Dexilant 60 mg daily but continues with pain.  Tells me though that this pain is not after eating but typically after having a bowel movement, he has a bowel movement once or twice a day or sometimes every other day, tells me if it is every other day it is hard.  After passing that stool he will then start with pain in his epigastrium and left upper quadrant which can last for 3 to 4 hours at a time and nothing seems to make it better.  He is worried that he may have cancer or tumor.  Does tell me that his appetite has been  decreased recently and that he is very worried.    Also describes some burning and reflux sensation, but I am not sure he has been using his medicine as prescribed.  Seems to be unaware of a finding of gastritis on his recent EGD.    Denies fever or chills.  Past Medical History:  Diagnosis Date   Allergic rhinitis    Anemia    B-12 deficiency   Arthritis    Diabetes mellitus without complication (HCC)    Discoloration of skin    L LEG   Elevated uric acid in blood    GERD (gastroesophageal reflux disease)    History of stomach ulcers    HTN (hypertension)    Hyperlipidemia    Major depressive disorder, recurrent (HCC)    Seasonal allergies    Vitamin D deficiency     Past Surgical History:  Procedure Laterality Date   CATARACT EXTRACTION W/ INTRAOCULAR LENS IMPLANT     COLONOSCOPY  05-2008   ESOPHAGOGASTRODUODENOSCOPY  05-2008   TOTAL HIP ARTHROPLASTY Right 07/02/2013   Procedure: RIGHT TOTAL HIP ARTHROPLASTY ANTERIOR APPROACH;  Surgeon: Shelda Pal, MD;  Location: [facility];  Service: Orthopedics;  Laterality: Right;    Current Outpatient Medications  Medication Sig Dispense Refill   aspirin EC 81 MG  tablet Take 81 mg by mouth daily.     Cholecalciferol (VITAMIN D-3) 125 MCG (5000 UT) TABS Take by mouth. occasionally     escitalopram (LEXAPRO) 10 MG tablet Take 10 mg by mouth daily.     esomeprazole (NEXIUM) 40 MG capsule Take 40 mg by mouth daily as needed.      losartan (COZAAR) 100 MG tablet Take 1 tablet (100 mg total) by mouth daily. 90 tablet 3   metFORMIN (GLUCOPHAGE) 500 MG tablet Take 500 mg by mouth daily with breakfast.     metoCLOPramide (REGLAN) 5 MG tablet Take 5 mg by mouth 4 (four) times daily -  before meals and at bedtime.     metoprolol succinate (TOPROL-XL) 25 MG 24 hr tablet Take 1 tablet (25 mg total) by mouth daily. Take with or immediately following a meal. 90 tablet 3   omeprazole (PRILOSEC) 20 MG capsule Take 1 capsule (20  mg total) by mouth daily as needed (Heart burn, Take on empty stomach). 90 capsule 1   simvastatin (ZOCOR) 40 MG tablet Take 1 tablet (40 mg total) by mouth at bedtime. 90 tablet 3   vitamin B-12 (CYANOCOBALAMIN) 1000 MCG tablet Take 1,000 mcg by mouth. occasionally     No current facility-administered medications for this visit.    Allergies as of 07/04/2019   (No Known Allergies)    Family History  Problem Relation Age of Onset   Heart attack Father 9   Hypertension Mother     Social History   Socioeconomic History   Marital status: Married    Spouse name: Not on file   Number of children: 2   Years of education: Not on file   Highest education level: Not on file  Occupational History   Occupation: self employed  Tobacco Use   Smoking status: Never Smoker   Smokeless tobacco: Never Used  Substance and Sexual Activity   Alcohol use: Yes    Comment: RARE   Drug use: No   Sexual activity: Not on file  Other Topics Concern   Not on file  Social History Narrative   Not on file   Social Determinants of Health   Financial Resource Strain:    Difficulty of Paying Living Expenses:   Food Insecurity:    Worried About Charity fundraiser in the Last Year:    Arboriculturist in the Last Year:   Transportation Needs:    Film/video editor (Medical):    Lack of Transportation (Non-Medical):   Physical Activity:    Days of Exercise per Week:    Minutes of Exercise per Session:   Stress:    Feeling of Stress :   Social Connections:    Frequency of Communication with Friends and Family:    Frequency of Social Gatherings with Friends and Family:    Attends Religious Services:    Active Member of Clubs or Organizations:    Attends Music therapist:    Marital Status:   Intimate Partner Violence:    Fear of Current or Ex-Partner:    Emotionally Abused:    Physically Abused:    Sexually Abused:     Review of  Systems:    Constitutional: No fever or chills Skin: No rash  Cardiovascular: No chest pain  Respiratory: No SOB Gastrointestinal: See HPI and otherwise negative Genitourinary: No dysuria  Neurological: No headache Musculoskeletal: No new muscle or joint pain Hematologic: No bleeding  Psychiatric: +anxiety and depression  Physical Exam:  Vital signs: BP 118/78 (BP Location: Left Arm, Patient Position: Sitting, Cuff Size: Normal)    Pulse 84    Temp (!) 97.2 F (36.2 C) (Other (Comment))    Ht 5\' 7"  (1.702 m)    Wt 141 lb 6 oz (64.1 kg)    BMI 22.14 kg/m   Constitutional:   Anxious male appears to be in NAD, Well developed, Well nourished, alert and cooperative Head:  Normocephalic and atraumatic. Eyes:   PEERL, EOMI. No icterus. Conjunctiva pink. Ears:  Normal auditory acuity. Neck:  Supple Throat: Oral cavity and pharynx without inflammation, swelling or lesion.  Respiratory: Respirations even and unlabored. Lungs clear to auscultation bilaterally.   No wheezes, crackles, or rhonchi.  Cardiovascular: Normal S1, S2. No MRG. Regular rate and rhythm. No peripheral edema, cyanosis or pallor.  Gastrointestinal:  Soft, nondistended, nontender. No rebound or guarding. Normal bowel sounds. No appreciable masses or hepatomegaly. Rectal:  Not performed.  Msk:  Symmetrical without gross deformities. Without edema, no deformity or joint abnormality.  Neurologic:  Alert and  oriented x4;  grossly normal neurologically.  Skin:   Dry and intact without significant lesions or rashes. Psychiatric: Oriented to person, place and time.Speaks quickly and hard to follow  See HPI for recent labs.  Assessment: 1.  Abdominal pain: Describes pain after having a bowel movement which is different than his epigastric discomfort he was feeling previously., CT abdomen and pelvis and EGD already done as well as labs which have been unremarkable; most likely IBS 2.  GERD: Gastritis seen at time of  recent EGD in January done for the symptoms, reviewed this with the patient 3.  Weight loss: Patient has lost 10 pounds per him, likely related to anxiety and a decrease in appetite  Plan: 1.  We will request records from Dr. February from last colonoscopy which is 3 to 4 years ago per the patient. 2.  We will proceed with colonoscopy with Dr. Loreta Ave for further evaluation of weight loss and continued abdominal pain after bowel movements. 3.  Tried to reassure the patient but he is very anxious.  I feel many of his symptoms are likely functional/related to IBS. 4.  Discussed with patient that he should continue the Dexilant 60 mg daily in the morning and discontinue Pantoprazole and Carafate for now.  This will help with his gastritis.  We discussed what this is and the fact that they have already looked inside of his upper stomach and we do not need to do this again.  If he continues on this medicine hopefully it will heal. 5.  Discussed constipation, which per the patient is when he has a hard stool.  He should increase water intake and fiber. 6.  Patient to follow in clinic per recommendations from Dr. Lavon Paganini after time of procedure.  Lavon Paganini, PA-C [facility] 07/04/2019, 11:01 AM  Cc: 07/06/2019, MD   **Of note, patient left our practice and then came back, Dr. Galvin Proffer does not accept him as patient. Patient assigned to supervising physician today, Dr. Russella Dar.  JLL

## 2019-07-05 ENCOUNTER — Telehealth: Payer: Self-pay

## 2019-07-05 NOTE — Telephone Encounter (Signed)
Tried to reach out the patient about his up coming Colonoscopy appointment. Left a voicemail for the patient to check hi MyChart message that was sent yesterday afternoon. Patient needs to reschedule his Colonoscopy to Dr. Lavon Paganini.   Patient has contacted me right back and has had his colonoscopy rescheduled to April 9 with Dr. Lavon Paganini at 9:30 am. I advised I would send him a message with updated date and time.

## 2019-07-10 ENCOUNTER — Encounter: Payer: BC Managed Care – PPO | Admitting: Gastroenterology

## 2019-07-10 DIAGNOSIS — R0989 Other specified symptoms and signs involving the circulatory and respiratory systems: Secondary | ICD-10-CM | POA: Diagnosis not present

## 2019-07-10 DIAGNOSIS — R011 Cardiac murmur, unspecified: Secondary | ICD-10-CM | POA: Diagnosis not present

## 2019-07-10 DIAGNOSIS — D518 Other vitamin B12 deficiency anemias: Secondary | ICD-10-CM | POA: Diagnosis not present

## 2019-07-10 DIAGNOSIS — K921 Melena: Secondary | ICD-10-CM | POA: Diagnosis not present

## 2019-07-11 DIAGNOSIS — R1013 Epigastric pain: Secondary | ICD-10-CM | POA: Diagnosis not present

## 2019-07-11 DIAGNOSIS — R634 Abnormal weight loss: Secondary | ICD-10-CM | POA: Diagnosis not present

## 2019-07-11 DIAGNOSIS — R0989 Other specified symptoms and signs involving the circulatory and respiratory systems: Secondary | ICD-10-CM | POA: Diagnosis not present

## 2019-07-11 DIAGNOSIS — I6523 Occlusion and stenosis of bilateral carotid arteries: Secondary | ICD-10-CM | POA: Diagnosis not present

## 2019-07-11 DIAGNOSIS — K219 Gastro-esophageal reflux disease without esophagitis: Secondary | ICD-10-CM | POA: Diagnosis not present

## 2019-07-11 DIAGNOSIS — R590 Localized enlarged lymph nodes: Secondary | ICD-10-CM | POA: Diagnosis not present

## 2019-07-15 DIAGNOSIS — I700 Atherosclerosis of aorta: Secondary | ICD-10-CM | POA: Diagnosis not present

## 2019-07-15 DIAGNOSIS — R221 Localized swelling, mass and lump, neck: Secondary | ICD-10-CM | POA: Diagnosis not present

## 2019-07-15 DIAGNOSIS — R2231 Localized swelling, mass and lump, right upper limb: Secondary | ICD-10-CM | POA: Diagnosis not present

## 2019-07-15 DIAGNOSIS — D492 Neoplasm of unspecified behavior of bone, soft tissue, and skin: Secondary | ICD-10-CM | POA: Diagnosis not present

## 2019-07-15 DIAGNOSIS — M79621 Pain in right upper arm: Secondary | ICD-10-CM | POA: Diagnosis not present

## 2019-07-15 DIAGNOSIS — E042 Nontoxic multinodular goiter: Secondary | ICD-10-CM | POA: Diagnosis not present

## 2019-07-15 DIAGNOSIS — I714 Abdominal aortic aneurysm, without rupture: Secondary | ICD-10-CM | POA: Diagnosis not present

## 2019-07-16 ENCOUNTER — Telehealth: Payer: Self-pay

## 2019-07-16 NOTE — Telephone Encounter (Signed)
Patient's PCP sent a referral for the patient to be seen by Dr. Chales Abrahams to schedule a colonoscopy on 07/11/19. This patient is a former patient of Dr. Russella Dar. Dr. Russella Dar has decided that since this patient had decided to see other providers in other GI practices that it would be best that this patient see someone else in the practice. This was explained to the patient on July 05, 2019 at which time he was scheduled to have his colonoscopy with Dr. Lavon Paganini. I have contacted the patient's PCP to let them know that he had been seen in our office prior to being seen with him on 07/10/19 and that he has already been scheduled for a Colonoscopy with Dr. Lavon Paganini. I have tried to reach out the patient to discuss this with him, but there was no answer and was unable to leave a voicemail.

## 2019-07-17 ENCOUNTER — Telehealth: Payer: Self-pay

## 2019-07-17 DIAGNOSIS — R634 Abnormal weight loss: Secondary | ICD-10-CM | POA: Diagnosis not present

## 2019-07-17 DIAGNOSIS — Z1211 Encounter for screening for malignant neoplasm of colon: Secondary | ICD-10-CM | POA: Diagnosis not present

## 2019-07-17 NOTE — Telephone Encounter (Signed)
Called pt to confirm whether or not he completed a COVID test for his upcoming colonoscopy scheduled for 07/19/19.  No answer.  LMTCB.  Pt returned call.  Pt stated that [place]  (There is no documentation of vaccine in Epic.  Pt also said that he had called on Monday to cancel his colonoscopy procedure that was scheduled for Friday 07/19/19.  Procedure had been cancelled in Epic but was showing again as a scheduled appt on the same day.  Cancelled appt in Epic.  Will forward notice to Dr. Lavon Paganini.

## 2019-07-17 NOTE — Telephone Encounter (Signed)
Please note that pt cancelled his colonoscopy that was scheduled for 07/19/19.

## 2019-07-17 NOTE — Telephone Encounter (Signed)
ok 

## 2019-07-19 ENCOUNTER — Encounter: Payer: BC Managed Care – PPO | Admitting: Gastroenterology

## 2019-07-26 DIAGNOSIS — F418 Other specified anxiety disorders: Secondary | ICD-10-CM | POA: Diagnosis not present

## 2019-07-26 DIAGNOSIS — R1013 Epigastric pain: Secondary | ICD-10-CM | POA: Diagnosis not present

## 2019-07-29 ENCOUNTER — Encounter (INDEPENDENT_AMBULATORY_CARE_PROVIDER_SITE_OTHER): Payer: Self-pay | Admitting: Otolaryngology

## 2019-07-29 ENCOUNTER — Other Ambulatory Visit: Payer: Self-pay

## 2019-07-29 ENCOUNTER — Ambulatory Visit (INDEPENDENT_AMBULATORY_CARE_PROVIDER_SITE_OTHER): Payer: BC Managed Care – PPO | Admitting: Otolaryngology

## 2019-07-29 VITALS — Temp 97.9°F

## 2019-07-29 DIAGNOSIS — K219 Gastro-esophageal reflux disease without esophagitis: Secondary | ICD-10-CM

## 2019-07-29 NOTE — Progress Notes (Addendum)
HPI: Justin [name] is a 64 y.o. male who presents for evaluation of chronic throat symptoms.  He apparently has history of GE reflux disease.  He apparently has been losing weight approximately 18 pounds over the past 7 months.  He also describes a lot of phlegm in his throat.  He is presently taking Prilosec 20 mg in the morning.  He has had recent upper endoscopy with normal examination.  He apparently also saw Dr.Teoh over a month ago with normal exam. He presents here for another evaluation and second opinion.  Past Medical History:  Diagnosis Date  . Allergic rhinitis   . Anemia    B-12 deficiency  . Arthritis   . Diabetes mellitus without complication (HCC)   . Discoloration of skin    L LEG  . Elevated uric acid in blood   . GERD (gastroesophageal reflux disease)   . History of stomach ulcers   . HTN (hypertension)   . Hyperlipidemia   . Major depressive disorder, recurrent (HCC)   . Seasonal allergies   . Vitamin D deficiency    Past Surgical History:  Procedure Laterality Date  . CATARACT EXTRACTION W/ INTRAOCULAR LENS IMPLANT    . COLONOSCOPY  05-2008  . ESOPHAGOGASTRODUODENOSCOPY  05-2008  . TOTAL HIP ARTHROPLASTY Right 07/02/2013   Procedure: RIGHT TOTAL HIP ARTHROPLASTY ANTERIOR APPROACH;  Surgeon: Shelda Pal, MD;  Location: [facility];  Service: Orthopedics;  Laterality: Right;   Social History   Socioeconomic History  . Marital status: Married    Spouse name: Not on file  . Number of children: 2  . Years of education: Not on file  . Highest education level: Not on file  Occupational History  . Occupation: self employed  Tobacco Use  . Smoking status: Never Smoker  . Smokeless tobacco: Never Used  Substance and Sexual Activity  . Alcohol use: Yes    Comment: RARE  . Drug use: No  . Sexual activity: Not on file  Other Topics Concern  . Not on file  Social History Narrative  . Not on file   Social Determinants of Health   Financial Resource Strain:   .  Difficulty of Paying Living Expenses:   Food Insecurity:   . Worried About Programme researcher, broadcasting/film/video in the Last Year:   . Barista in the Last Year:   Transportation Needs:   . Freight forwarder (Medical):   Marland Kitchen Lack of Transportation (Non-Medical):   Physical Activity:   . Days of Exercise per Week:   . Minutes of Exercise per Session:   Stress:   . Feeling of Stress :   Social Connections:   . Frequency of Communication with Friends and Family:   . Frequency of Social Gatherings with Friends and Family:   . Attends Religious Services:   . Active Member of Clubs or Organizations:   . Attends Banker Meetings:   Marland Kitchen Marital Status:    Family History  Problem Relation Age of Onset  . Heart attack Father 30  . Hypertension Mother    No Known Allergies Prior to Admission medications   Medication Sig Start Date End Date Taking? Authorizing Provider  aspirin EC 81 MG tablet Take 81 mg by mouth daily.   Yes [provider]  Cholecalciferol (VITAMIN D-3) 125 MCG (5000 UT) TABS Take by mouth. occasionally   Yes [provider]  escitalopram (LEXAPRO) 10 MG tablet Take 10 mg by mouth daily.  Yes [provider]  esomeprazole (NEXIUM) 40 MG capsule Take 40 mg by mouth daily as needed.    Yes [provider]  losartan (COZAAR) 100 MG tablet Take 1 tablet (100 mg total) by mouth daily. 01/21/19  Yes Adrian Prows, MD  metFORMIN (GLUCOPHAGE) 500 MG tablet Take 500 mg by mouth daily with breakfast.   Yes [provider]  metoCLOPramide (REGLAN) 5 MG tablet Take 5 mg by mouth 4 (four) times daily -  before meals and at bedtime.   Yes [provider]  metoprolol succinate (TOPROL-XL) 25 MG 24 hr tablet Take 1 tablet (25 mg total) by mouth daily. Take with or immediately following a meal. 01/21/19  Yes Adrian Prows, MD  omeprazole (PRILOSEC) 20 MG capsule Take 1 capsule (20 mg total) by mouth daily as needed (Heart burn, Take on  empty stomach). 01/21/19  Yes Adrian Prows, MD  simvastatin (ZOCOR) 40 MG tablet Take 1 tablet (40 mg total) by mouth at bedtime. 01/21/19  Yes Adrian Prows, MD  vitamin B-12 (CYANOCOBALAMIN) 1000 MCG tablet Take 1,000 mcg by mouth. occasionally   Yes [provider]     Positive ROS: Otherwise negative  All other systems have been reviewed and were otherwise negative with the exception of those mentioned in the HPI and as above.  Physical Exam: Constitutional: Alert, well-appearing, no acute distress Ears: External ears without lesions or tenderness. Ear canals are clear bilaterally with intact, clear TMs bilaterally. Nasal: External nose without lesions. Septum slightly deviated to the left.  Both middle meatus regions were clear with no signs of infection and no significant drainage noted within the nasal cavity.. Clear nasal passages Oral: Lips and gums without lesions. Tongue and palate mucosa without lesions. Posterior oropharynx clear.  Tonsil regions appear benign bilaterally.  Patient has a very strong gag reflex and unable to perform indirect laryngoscopy. Fiberoptic laryngoscopy was performed to the right nostril.  Nasopharynx was clear.  Base of tongue vallecula epiglottis were normal.  Vocal cords were clear bilaterally with normal vocal cord mobility.  Both AE folds were normal.  Both piriform sinuses were clear.  No mucosal abnormalities noted. Neck: No palpable adenopathy or masses.  No palpable thyroid nodules noted. Respiratory: Breathing comfortably  Skin: No facial/neck lesions or rash noted.  Laryngoscopy  Date/Time: 07/29/2019 4:32 PM Performed by: Rozetta Nunnery, MD Authorized by: Rozetta Nunnery, MD   Consent:    Consent obtained:  Verbal   Consent given by:  Patient Procedure details:    Indications: excessive gag reflex preventing mirror examination     Instrument: flexible fiberoptic laryngoscope     Scope location: right nare   Sinus:     Right nasopharynx: normal   Mouth:    Oropharynx: normal     Base of tongue: normal     Epiglottis: normal   Throat:    True vocal cords: normal   Comments:     Hypopharynx and larynx was normal to upper airway fiberoptic laryngoscopy.  No evidence of active infection or neoplasm.  Possible GE reflux disease.    Assessment: Probable GE reflux disease. Normal upper airway examination on fiberoptic laryngoscopy.  Plan: Recommended taking the Prilosec before dinner instead of first thing in the morning as this will provide better nighttime coverage when people have more reflux problems.  Radene Journey, MD

## 2019-08-08 DIAGNOSIS — J3089 Other allergic rhinitis: Secondary | ICD-10-CM | POA: Diagnosis not present

## 2019-08-08 DIAGNOSIS — J4540 Moderate persistent asthma, uncomplicated: Secondary | ICD-10-CM | POA: Diagnosis not present

## 2019-08-08 DIAGNOSIS — J301 Allergic rhinitis due to pollen: Secondary | ICD-10-CM | POA: Diagnosis not present

## 2019-08-08 DIAGNOSIS — H1045 Other chronic allergic conjunctivitis: Secondary | ICD-10-CM | POA: Diagnosis not present

## 2019-08-13 DIAGNOSIS — K5904 Chronic idiopathic constipation: Secondary | ICD-10-CM | POA: Diagnosis not present

## 2019-08-13 DIAGNOSIS — R634 Abnormal weight loss: Secondary | ICD-10-CM | POA: Diagnosis not present

## 2019-08-13 DIAGNOSIS — R140 Abdominal distension (gaseous): Secondary | ICD-10-CM | POA: Diagnosis not present

## 2019-08-13 DIAGNOSIS — R1013 Epigastric pain: Secondary | ICD-10-CM | POA: Diagnosis not present

## 2019-08-14 DIAGNOSIS — E782 Mixed hyperlipidemia: Secondary | ICD-10-CM | POA: Diagnosis not present

## 2019-08-14 DIAGNOSIS — I10 Essential (primary) hypertension: Secondary | ICD-10-CM | POA: Diagnosis not present

## 2019-08-14 DIAGNOSIS — E1165 Type 2 diabetes mellitus with hyperglycemia: Secondary | ICD-10-CM | POA: Diagnosis not present

## 2019-08-14 DIAGNOSIS — E559 Vitamin D deficiency, unspecified: Secondary | ICD-10-CM | POA: Diagnosis not present

## 2019-08-20 DIAGNOSIS — R634 Abnormal weight loss: Secondary | ICD-10-CM | POA: Diagnosis not present

## 2019-08-20 DIAGNOSIS — R748 Abnormal levels of other serum enzymes: Secondary | ICD-10-CM | POA: Diagnosis not present

## 2019-08-20 DIAGNOSIS — R1013 Epigastric pain: Secondary | ICD-10-CM | POA: Diagnosis not present

## 2019-08-28 DIAGNOSIS — E782 Mixed hyperlipidemia: Secondary | ICD-10-CM | POA: Diagnosis not present

## 2019-08-28 DIAGNOSIS — D518 Other vitamin B12 deficiency anemias: Secondary | ICD-10-CM | POA: Diagnosis not present

## 2019-08-28 DIAGNOSIS — I10 Essential (primary) hypertension: Secondary | ICD-10-CM | POA: Diagnosis not present

## 2019-08-28 DIAGNOSIS — Z79899 Other long term (current) drug therapy: Secondary | ICD-10-CM | POA: Diagnosis not present

## 2019-08-28 DIAGNOSIS — E559 Vitamin D deficiency, unspecified: Secondary | ICD-10-CM | POA: Diagnosis not present

## 2019-09-06 ENCOUNTER — Encounter (HOSPITAL_COMMUNITY): Payer: Self-pay | Admitting: Psychiatry

## 2019-09-06 ENCOUNTER — Telehealth (INDEPENDENT_AMBULATORY_CARE_PROVIDER_SITE_OTHER): Payer: BC Managed Care – PPO | Admitting: Psychiatry

## 2019-09-06 ENCOUNTER — Other Ambulatory Visit: Payer: Self-pay

## 2019-09-06 VITALS — Wt 140.0 lb

## 2019-09-06 DIAGNOSIS — F4521 Hypochondriasis: Secondary | ICD-10-CM

## 2019-09-06 DIAGNOSIS — F411 Generalized anxiety disorder: Secondary | ICD-10-CM

## 2019-09-06 MED ORDER — VENLAFAXINE HCL ER 37.5 MG PO CP24: ORAL_CAPSULE | ORAL | 0 refills | Status: DC

## 2019-09-06 NOTE — Progress Notes (Signed)
Virtual Visit via Video Note  I connected with Justin [name] on 09/06/19 at  9:00 AM EDT by a video enabled telemedicine application and verified that I am speaking with the correct person using two identifiers.   I discussed the limitations of evaluation and management by telemedicine and the availability of in person appointments. The patient expressed understanding and agreed to proceed.   [facility] Initial Assessment Note  Justin [name] [id number] 64 y.o.  09/06/2019 9:04 AM  Patient Location: Home  Provider Location: Home Office  Chief Complaint:  I have a fear that I have cancer.  History of Present Illness:  Patient is a 64 year old [place], married, self-employed man who is referred from his PCP and GI for repeat evaluation of anxiety symptoms.  Patient told that he is very anxious, nervous and fear that he has stomach cancer.  He had stopped eating for the same reason.  He had lost almost 15 pounds in the past 10 months.  He has seen PCP, GI Dr. Collene Mares, multiple ENT physicians, cardiology and have extensive work-up including colonoscopy, endoscopy and studies of test which includes thyroid, vitamin D level, CBC, chemistry and work-up for H. pylori.  Patient told all these tests are normal.  Despite reassurance from multiple physicians which she has seen in recent months he still have obsession about stomach cancer.  Lately he is having allergy symptoms and he has seen multiple ENTs and found to have no major concerns.  Patient preoccupied with his somatic complaints about cough, stomach pain, abdominal pain, occasional nausea and vomiting.  He admitted for past few days he is somewhat better because his PCP has given Xanax 0.25 mg which he takes as needed and it has helped sleep.  Otherwise he recall he was not sleeping, not eating, and has lost interest in his daily activities.  Patient denies any other major issues.  His business is going well.  He is happy in his  marriage and he has 2 children who live close by and he sees grandkids on a regular basis.  He denies any hallucination, paranoia, suicidal thoughts, anger, violence, impulsive behavior.  He admitted some time he feels hopeless, helpless with decreased energy, fatigue, excessive worries about his general health.  Sometime he admitted himself sitting and lying on the hospital bed because he does not want to be get sick.  He denies going to have issues in the public place.  He has his own business and he is going regularly for some time he got upset and irritable.  Denies mania or any psychosis.  Both parents are deceased.  We do not recall any history of family stomach cancer.  In recent months he has given multiple antidepressants including Prozac, Lexapro, amitriptyline but he that he did not fill the prescription or never took it regularly.  Now he realized that he need to take something to get his symptoms under control.  He admitted over thinking had caused mental exhaustion and like to get better.  He is thought it eating little bit better in the past few days.  He endorsed occasional drinking especially on the weekends but he used to drink more regularly which he is stopped 6 months ago.  He denies any illegal drug use.  He lives with his wife who is very supportive.     Past Psychiatric History: No history of psychiatric inpatient treatment, suicidal attempt, psychosis, mania, abuse.  Had given Lexapro, Prozac, amitriptyline from PCP but never  consistent and some of them never picked up from the pharmacy.  PCP started Xanax 0.25 mg few weeks ago.    Family History: Denies any family history of psychiatric symptoms or mental illness.  Past Medical History:  Diagnosis Date  . Allergic rhinitis   . Anemia    B-12 deficiency  . Arthritis   . Diabetes mellitus without complication (HCC)   . Discoloration of skin    L LEG  . Elevated uric acid in blood   . GERD (gastroesophageal reflux disease)    . History of stomach ulcers   . HTN (hypertension)   . Hyperlipidemia   . Major depressive disorder, recurrent (HCC)   . Seasonal allergies   . Vitamin D deficiency      Traumatic brain injury: Denies any history of traumatic brain injury.  Work History; Self-employed.  Psychosocial History; Born in [place].  Living in [place] for a long time.  He is married and he has a son and a daughter who live close by.  Legal History; Denies any history of legal issues.  History Of Abuse; Denies any history of abuse.  Substance Abuse History; Denies any history of illegal substance use.  Neurologic: Headache: No Seizure: No Paresthesias: No   Outpatient Encounter Medications as of 09/06/2019  Medication Sig  . ALPRAZolam (XANAX) 0.25 MG tablet Take 0.25 mg by mouth daily as needed.  . Cholecalciferol (VITAMIN D-3) 125 MCG (5000 UT) TABS Take by mouth. occasionally  . esomeprazole (NEXIUM) 40 MG capsule Take 40 mg by mouth daily as needed.   Marland Kitchen losartan (COZAAR) 100 MG tablet Take 1 tablet (100 mg total) by mouth daily.  . simvastatin (ZOCOR) 40 MG tablet Take 1 tablet (40 mg total) by mouth at bedtime.  . vitamin B-12 (CYANOCOBALAMIN) 1000 MCG tablet Take 1,000 mcg by mouth. occasionally  . aspirin EC 81 MG tablet Take 81 mg by mouth daily.  . pantoprazole (PROTONIX) 40 MG tablet Take 40 mg by mouth daily.  . [DISCONTINUED] escitalopram (LEXAPRO) 10 MG tablet Take 10 mg by mouth daily.  . [DISCONTINUED] famotidine (PEPCID) 40 MG tablet Take 40 mg by mouth 2 (two) times daily.  . [DISCONTINUED] metFORMIN (GLUCOPHAGE) 500 MG tablet Take 500 mg by mouth daily with breakfast.  . [DISCONTINUED] metoCLOPramide (REGLAN) 5 MG tablet Take 5 mg by mouth 4 (four) times daily -  before meals and at bedtime.  . [DISCONTINUED] metoprolol succinate (TOPROL-XL) 25 MG 24 hr tablet Take 1 tablet (25 mg total) by mouth daily. Take with or immediately following a meal. (Patient not taking:  Reported on 09/06/2019)  . [DISCONTINUED] omeprazole (PRILOSEC) 20 MG capsule Take 1 capsule (20 mg total) by mouth daily as needed (Heart burn, Take on empty stomach). (Patient not taking: Reported on 09/06/2019)   No facility-administered encounter medications on file as of 09/06/2019.    No results found for this or any previous visit (from the past 2160 hour(s)).    Constitutional:  Wt 140 lb (63.5 kg)   BMI 21.93 kg/m    Musculoskeletal: Strength & Muscle Tone: within normal limits Gait & Station: normal Patient leans: N/A  Psychiatric Specialty Exam: Physical Exam  ROS  Weight 140 lb (63.5 kg).There is no height or weight on file to calculate BMI.  General Appearance: Casual  Eye Contact:  Good  Speech:  Clear and Coherent and Slow  Volume:  Normal  Mood:  Anxious and Dysphoric  Affect:  Congruent  Thought Process:  Goal  Directed  Orientation:  Full (Time, Place, and Person)  Thought Content:  Obsessions and Rumination  Suicidal Thoughts:  No  Homicidal Thoughts:  No  Memory:  Immediate;   Good Recent;   Good Remote;   Good  Judgement:  Fair  Insight:  Fair  Psychomotor Activity:  Normal  Concentration:  Concentration: Fair and Attention Span: Fair  Recall:  Fair  Fund of Knowledge:  Good  Language:  Fair  Akathisia:  No  Handed:  Right  AIMS (if indicated):     Assets:  Communication Skills Desire for Improvement Housing Social Support Transportation  ADL's:  Intact  Cognition:  WNL  Sleep:   fair       Assessment and Plan: Patient is 64 year old man with history of significant anxiety presented with symptoms preoccupied somatic complaints.  He had not consistent with previous antidepressant but like to get better now.  I reviewed his current medication, blood work results.  I recommend to try venlafaxine which has least GI side effects to try to help his anxiety symptoms.  I recommend to continue Xanax 0.25 mg prescribed by PCP to take only as  needed.  I discussed benzodiazepine dependence tolerance withdrawal.  Also recommend to try melatonin over-the-counter if he cannot sleep well.  He agreed with the plan.  He is not interested in therapy.  We will start venlafaxine 37.5 mg daily for 2-week and then he can take 2 capsule daily.  I recommend to call us back if he has any questions or any concerns.  I provided nurse triage line if he has any concerns about the medication.  Follow-up in 3 weeks.  Discuss safety concerns at any time having active suicidal thoughts or homicidal thoughts that he need to call 911 or go to local emergency room.   Follow Up Instructions:    I discussed the assessment and treatment plan with the patient. The patient was provided an opportunity to ask questions and all were answered. The patient agreed with the plan and demonstrated an understanding of the instructions.   The patient was advised to call back or seek an in-person evaluation if the symptoms worsen or if the condition fails to improve as anticipated.  I provided 55 minutes of non-face-to-face time during this encounter.   Cleotis Nipper, MD

## 2019-09-26 ENCOUNTER — Telehealth (HOSPITAL_COMMUNITY): Payer: BC Managed Care – PPO | Admitting: Psychiatry

## 2019-10-02 ENCOUNTER — Telehealth (HOSPITAL_COMMUNITY): Payer: BC Managed Care – PPO | Admitting: Psychiatry

## 2019-10-08 ENCOUNTER — Other Ambulatory Visit: Payer: Self-pay

## 2019-10-08 ENCOUNTER — Telehealth (HOSPITAL_COMMUNITY): Payer: BC Managed Care – PPO | Admitting: Psychiatry

## 2019-12-12 DIAGNOSIS — R1909 Other intra-abdominal and pelvic swelling, mass and lump: Secondary | ICD-10-CM | POA: Diagnosis not present

## 2019-12-12 DIAGNOSIS — D492 Neoplasm of unspecified behavior of bone, soft tissue, and skin: Secondary | ICD-10-CM | POA: Diagnosis not present

## 2019-12-13 DIAGNOSIS — M79606 Pain in leg, unspecified: Secondary | ICD-10-CM | POA: Diagnosis not present

## 2019-12-13 DIAGNOSIS — I739 Peripheral vascular disease, unspecified: Secondary | ICD-10-CM | POA: Diagnosis not present

## 2019-12-17 DIAGNOSIS — I70219 Atherosclerosis of native arteries of extremities with intermittent claudication, unspecified extremity: Secondary | ICD-10-CM | POA: Diagnosis not present

## 2019-12-17 DIAGNOSIS — I70229 Atherosclerosis of native arteries of extremities with rest pain, unspecified extremity: Secondary | ICD-10-CM | POA: Diagnosis not present

## 2019-12-17 DIAGNOSIS — I739 Peripheral vascular disease, unspecified: Secondary | ICD-10-CM | POA: Diagnosis not present

## 2019-12-18 DIAGNOSIS — I10 Essential (primary) hypertension: Secondary | ICD-10-CM | POA: Diagnosis not present

## 2019-12-18 DIAGNOSIS — D518 Other vitamin B12 deficiency anemias: Secondary | ICD-10-CM | POA: Diagnosis not present

## 2019-12-18 DIAGNOSIS — Z79899 Other long term (current) drug therapy: Secondary | ICD-10-CM | POA: Diagnosis not present

## 2019-12-18 DIAGNOSIS — E782 Mixed hyperlipidemia: Secondary | ICD-10-CM | POA: Diagnosis not present

## 2019-12-18 DIAGNOSIS — E1165 Type 2 diabetes mellitus with hyperglycemia: Secondary | ICD-10-CM | POA: Diagnosis not present

## 2020-01-20 ENCOUNTER — Ambulatory Visit: Payer: BC Managed Care – PPO | Admitting: Cardiology

## 2020-01-23 ENCOUNTER — Ambulatory Visit: Payer: BC Managed Care – PPO | Admitting: Cardiology

## 2020-06-22 ENCOUNTER — Other Ambulatory Visit: Payer: Self-pay | Admitting: Cardiology

## 2020-06-22 DIAGNOSIS — I10 Essential (primary) hypertension: Secondary | ICD-10-CM

## 2020-10-05 ENCOUNTER — Other Ambulatory Visit: Payer: Self-pay | Admitting: Cardiology

## 2020-10-05 DIAGNOSIS — I10 Essential (primary) hypertension: Secondary | ICD-10-CM

## 2020-12-22 DIAGNOSIS — J301 Allergic rhinitis due to pollen: Secondary | ICD-10-CM | POA: Diagnosis not present

## 2020-12-22 DIAGNOSIS — J4540 Moderate persistent asthma, uncomplicated: Secondary | ICD-10-CM | POA: Diagnosis not present

## 2020-12-22 DIAGNOSIS — J3089 Other allergic rhinitis: Secondary | ICD-10-CM | POA: Diagnosis not present

## 2020-12-22 DIAGNOSIS — H1045 Other chronic allergic conjunctivitis: Secondary | ICD-10-CM | POA: Diagnosis not present

## 2021-04-27 DIAGNOSIS — L308 Other specified dermatitis: Secondary | ICD-10-CM | POA: Diagnosis not present

## 2021-04-27 DIAGNOSIS — L301 Dyshidrosis [pompholyx]: Secondary | ICD-10-CM | POA: Diagnosis not present

## 2021-04-27 DIAGNOSIS — L280 Lichen simplex chronicus: Secondary | ICD-10-CM | POA: Diagnosis not present

## 2021-05-31 DIAGNOSIS — L280 Lichen simplex chronicus: Secondary | ICD-10-CM | POA: Diagnosis not present

## 2021-05-31 DIAGNOSIS — L301 Dyshidrosis [pompholyx]: Secondary | ICD-10-CM | POA: Diagnosis not present

## 2021-05-31 DIAGNOSIS — L2089 Other atopic dermatitis: Secondary | ICD-10-CM | POA: Diagnosis not present

## 2021-08-19 DIAGNOSIS — M79672 Pain in left foot: Secondary | ICD-10-CM | POA: Diagnosis not present

## 2021-08-19 DIAGNOSIS — M79671 Pain in right foot: Secondary | ICD-10-CM | POA: Diagnosis not present

## 2021-09-09 DIAGNOSIS — J4540 Moderate persistent asthma, uncomplicated: Secondary | ICD-10-CM | POA: Diagnosis not present

## 2021-09-09 DIAGNOSIS — J3089 Other allergic rhinitis: Secondary | ICD-10-CM | POA: Diagnosis not present

## 2021-09-09 DIAGNOSIS — H1045 Other chronic allergic conjunctivitis: Secondary | ICD-10-CM | POA: Diagnosis not present

## 2021-09-09 DIAGNOSIS — J301 Allergic rhinitis due to pollen: Secondary | ICD-10-CM | POA: Diagnosis not present

## 2021-09-23 DIAGNOSIS — M25552 Pain in left hip: Secondary | ICD-10-CM | POA: Diagnosis not present

## 2021-10-13 DIAGNOSIS — L2089 Other atopic dermatitis: Secondary | ICD-10-CM | POA: Diagnosis not present

## 2021-10-13 DIAGNOSIS — L301 Dyshidrosis [pompholyx]: Secondary | ICD-10-CM | POA: Diagnosis not present

## 2021-11-15 NOTE — Progress Notes (Unsigned)
New Patient Note  RE: Justin [name] MRN: [id number] DOB: 09-14-1955 Date of Office Visit: 11/16/2021  Consult requested by: Bonnita Nasuti, MD Primary care provider: Bonnita Nasuti, MD  Chief Complaint: Other (Has patches of dry skin all over the body. )  History of Present Illness: I had the pleasure of seeing Justin [name] for initial evaluation at [facility] on 11/16/2021. He is a 66 y.o. male, who is self-referred here for the evaluation of rash. Spoke with daughter on the phone regarding assessment and plan.  Rash started in October 2022. This can occur anywhere on his body. Describes them as itchy, raised, red, some look like little pimples/blisters. Individual rashes lasts about 3-4 days. Associated symptoms include: none.  Frequency of episodes: daily. Suspected triggers are unknown but he does wash his hands a lot. Denies any fevers, chills, changes in medications, foods, personal care products or recent infections. He has tried the following therapies: clobetasol with some benefit. Systemic steroids: steroid injection in May for a sinus infection which helped the rash as well.  Previous work up includes: saw dermatology 3-4 weeks ago and was given clobetasol cream. Apparently Dupixent was going to cost him $1300/month so he did not start.  Previous history of rash/hives: had rash on the legs for 15+ years. Patient is up to date with the following cancer screening tests: physical exam, colonoscopy.  Assessment and Plan: Justin [name] is a 66 y.o. male with: Dyshidrotic eczema Itchy rash started in October 2022. This can occur anywhere but more persistent on hands. Saw dermatology and has been using clobetasol with some benefit. Still having daily symptoms. Concerned about allergic triggers.  Today's skin prick testing showed: Positive to grass, weed, ragweed, trees, mold. Negative to common foods.  Discussed with patient that you do not have to have allergic trigger  for this.  Take zyrtec (cetirizine) 10mg  daily for the itching in the morning. Take hydroxyzine 25mg  1 hour before bedtime to help with the itching. See below for proper skin care. May use clobetasol as needed on the hands. Do not use on the face, neck, armpits or groin area. Use Eucrisa (crisaborole) 2% ointment twice a day on mild rash flares on the face and body. This is a non-steroid ointment. Samples given.  Consider starting Dupixent for the hand rash - handout given.  Our biologics coordinator will be in contact with you regarding cost.  ? Psoriasis Rash on left leg for 15+ years that is silvery/whitish. Patient follows with dermatology. Clobetasol ineffective. Clinical appearance concerning for possible psoriasis rash. Advised patient to follow up with dermatology regarding this.   Other allergic rhinitis Rhinoconjunctivitis symptoms in the spring and has been taking a steroid injection annually for the last 20 years.  Was on AIT briefly but stopped due to schedule conflicts. Asymptomatic in [place].  Today's skin prick testing showed: Positive to grass, weed, ragweed, trees, mold. Start environmental control measures as below. Use over the counter antihistamines such as Zyrtec (cetirizine), Claritin (loratadine), Allegra (fexofenadine), or Xyzal (levocetirizine) daily as needed. May switch antihistamines every few months. Recommend starting other medications during the spring time as well such as nasal sprays, eye drops if needed. Will see him back during the spring. Consider allergy injections for long term control if above medications do not help the symptoms - handout given.   Wheezing Wheezing only in the spring and uses albuterol prn with good benefit. Monitor symptoms. May use albuterol rescue inhaler  2 puffs or nebulizer every 4 to 6 hours as needed for shortness of breath, chest tightness, coughing, and wheezing. Monitor frequency of use.  Get spirometry at next  visit.  Return in about 2 months (around 01/16/2022).  Meds ordered this encounter  Medications   hydrOXYzine (ATARAX) 25 MG tablet    Sig: Take 1 tablet (25 mg total) by mouth at bedtime as needed for itching.    Dispense:  30 tablet    Refill:  3   Crisaborole (EUCRISA) 2 % OINT    Sig: Apply 1 Application topically 2 (two) times daily as needed.    Dispense:  60 g    Refill:  5   Lab Orders  No laboratory test(s) ordered today    Other allergy screening: Asthma:  Uses albuterol in the spring due to wheezing with good benefit.  Rhino conjunctivitis: yes Rhino conjunctivitis symptoms in the spring and every spring he has gotten a steroid injection for this for the last 20 years or so.  Patient had allergy testing done at [facility] and was on AIT briefly as he travels to [place] twice per year for 1-2 months at a time. Asymptomatic while in [place].   Food allergy: no Medication allergy: no Hymenoptera allergy: no History of recurrent infections suggestive of immunodeficency: no  Diagnostics: Skin Testing: Environmental allergy panel and select foods. Positive to grass, weed, ragweed, trees, mold. Negative to common foods.  Results discussed with patient/family.  Airborne Adult Perc - 11/16/21 1052     Time Antigen Placed 1052    Allergen Manufacturer Waynette Buttery    Location Back    Number of Test 59    1. Control-Buffer 50% Glycerol Negative    2. Control-Histamine 1 mg/ml 2+    3. Albumin saline Negative    4. Bahia 3+    5. [place] 4+    6. Johnson 3+    7. Kentucky Blue Negative    8. Meadow Fescue 3+    9. Perennial Rye 4+    10. Sweet Vernal 4+    11. Timothy 4+    12. Cocklebur 2+    13. Burweed Marshelder Negative    14. Ragweed, short 3+    15. Ragweed, Giant Negative    16. Plantain,  English Negative    17. Lamb's Quarters Negative    18. Sheep Sorrell 4+    19. Rough Pigweed 4+    20. Marsh Elder, Rough Negative    21. Mugwort, Common 2+    22.  Ash mix 4+    23. Birch mix 4+    24. Beech American 4+    25. Box, Elder 3+    26. Cedar, red Negative    27. Cottonwood, Eastern 3+    28. Elm mix Negative    29. Hickory 4+    30. Maple mix 3+    31. Oak, Guinea[place]au mix 3+    32. Pecan Pollen Negative    33. Pine mix 2+    34. Sycamore Eastern 3+    35. Walnut, Black Pollen 3+    36. Alternaria alternata Negative    37. Cladosporium Herbarum Negative    38. Aspergillus mix 2+    39. Penicillium mix 2+    40. Bipolaris sorokiniana (Helminthosporium) Negative    41. Drechslera spicifera (Curvularia) Negative    42. Mucor plumbeus Negative    43. Fusarium moniliforme Negative    44. Aureobasidium pullulans (pullulara) Negative  45. Rhizopus oryzae Negative    46. Botrytis cinera Negative    47. Epicoccum nigrum Negative    48. Phoma betae Negative    49. Candida Albicans Negative    50. Trichophyton mentagrophytes Negative    51. Mite, D Farinae  5,000 AU/ml Negative    52. Mite, D Pteronyssinus  5,000 AU/ml Negative    53. Cat Hair 10,000 BAU/ml Negative    54.  Dog Epithelia Negative    55. Mixed Feathers Negative    56. Horse Epithelia Negative    57. Cockroach, German Negative    58. Mouse Negative    59. Tobacco Leaf Negative             Food Perc - 11/16/21 1052       Test Information   Time Antigen Placed 1052    Allergen Manufacturer Lavella Hammock    Location Back    Number of allergen test 10      Food   1. Peanut Negative    2. Soybean food Negative    3. Wheat, whole Negative    4. Sesame Negative    5. Milk, cow Negative    6. Egg White, chicken Negative    7. Casein Negative    8. Shellfish mix Negative    9. Fish mix Negative    10. Cashew Negative             Past Medical History: Patient Active Problem List   Diagnosis Date Noted   Dyshidrotic eczema 11/16/2021   Wheezing 11/16/2021   ? Psoriasis 11/16/2021   Family history of early CAD 07/30/2018   Obstructive sleep apnea on CPAP  12/24/2013   Expected blood loss anemia 07/03/2013   Overweight (BMI 25.0-29.9) 07/03/2013   Hypokalemia 07/03/2013   S/P right THA, AA 07/02/2013   S/P total hip arthroplasty 07/02/2013   Flatulence, eructation, and gas pain 11/05/2009   DIARRHEA 11/05/2009   IMPAIRED GLUCOSE TOLERANCE 02/28/2008   Hypercholesteremia 02/28/2008   Essential hypertension 02/28/2008   Other allergic rhinitis 02/28/2008   GERD 02/28/2008   Past Medical History:  Diagnosis Date   Allergic rhinitis    Anemia    B-12 deficiency   Arthritis    Diabetes mellitus without complication (HCC)    Discoloration of skin    L LEG   Elevated uric acid in blood    GERD (gastroesophageal reflux disease)    History of stomach ulcers    HTN (hypertension)    Hyperlipidemia    Major depressive disorder, recurrent (HCC)    Seasonal allergies    Vitamin D deficiency    Past Surgical History: Past Surgical History:  Procedure Laterality Date   CATARACT EXTRACTION W/ INTRAOCULAR LENS IMPLANT     COLONOSCOPY  05-2008   ESOPHAGOGASTRODUODENOSCOPY  05-2008   TOTAL HIP ARTHROPLASTY Right 07/02/2013   Procedure: RIGHT TOTAL HIP ARTHROPLASTY ANTERIOR APPROACH;  Surgeon: Mauri Pole, MD;  Location: [facility];  Service: Orthopedics;  Laterality: Right;   Medication List:  Current Outpatient Medications  Medication Sig Dispense Refill   albuterol (PROAIR HFA) 108 (90 Base) MCG/ACT inhaler 1-2 puffs     clobetasol ointment (TEMOVATE) [id number] % Apply 1 Application topically daily.     Crisaborole (EUCRISA) 2 % OINT Apply 1 Application topically 2 (two) times daily as needed. 60 g 5   DUPIXENT 300 MG/2ML SOPN Inject into the skin.     hydrOXYzine (ATARAX) 25 MG tablet Take 1 tablet (25 mg total)  by mouth at bedtime as needed for itching. 30 tablet 3   losartan (COZAAR) 100 MG tablet Take 1 tablet (100 mg total) by mouth daily. 90 tablet 3   simvastatin (ZOCOR) 40 MG tablet Take 1 tablet (40 mg total) by mouth at bedtime. 90  tablet 3   No current facility-administered medications for this visit.   Allergies: No Known Allergies Social History: Social History   Socioeconomic History   Marital status: Married    Spouse name: Not on file   Number of children: 2   Years of education: Not on file   Highest education level: Not on file  Occupational History   Occupation: self employed  Tobacco Use   Smoking status: Never   Smokeless tobacco: Never  Substance and Sexual Activity   Alcohol use: Yes    Comment: RARE   Drug use: No   Sexual activity: Not on file  Other Topics Concern   Not on file  Social History Narrative   Not on file   Social Determinants of Health   Financial Resource Strain: Not on file  Food Insecurity: Not on file  Transportation Needs: Not on file  Physical Activity: Not on file  Stress: Not on file  Social Connections: Not on file   Lives in a house. Smoking: denies Occupation: Science writer.   Environmental History: Water Damage/mildew in the house: no Carpet in the family room: no Carpet in the bedroom: no Heating: gas Cooling: central Pet: no  Family History: Family History  Problem Relation Age of Onset   Heart attack Father 39   Hypertension Mother    Problem                               Relation Asthma                                   no Eczema                                no Food allergy                          no Allergic rhino conjunctivitis     no  Review of Systems  Constitutional:  Negative for appetite change, chills, fever and unexpected weight change.  HENT:  Negative for congestion and rhinorrhea.   Eyes:  Negative for itching.  Respiratory:  Negative for cough, chest tightness, shortness of breath and wheezing.   Cardiovascular:  Negative for chest pain.  Gastrointestinal:  Negative for abdominal pain.  Genitourinary:  Negative for difficulty urinating.  Skin:  Positive for rash.  Allergic/Immunologic: Positive for  environmental allergies. Negative for food allergies.  Neurological:  Negative for headaches.    Objective: BP 124/70   Pulse 60   Temp 98.1 F (36.7 C)   Resp 18   Ht 5\' 8"  (1.727 m)   Wt 165 lb (74.8 kg)   SpO2 97%   BMI 25.09 kg/m  Body mass index is 25.09 kg/m. Physical Exam Vitals and nursing note reviewed.  Constitutional:      Appearance: Normal appearance. He is well-developed.  HENT:     Head: Normocephalic and atraumatic.     Right Ear: Tympanic membrane and external ear normal.  Left Ear: Tympanic membrane and external ear normal.     Nose: Nose normal.     Mouth/Throat:     Mouth: Mucous membranes are moist.     Pharynx: Oropharynx is clear.  Eyes:     Conjunctiva/sclera: Conjunctivae normal.  Cardiovascular:     Rate and Rhythm: Normal rate and regular rhythm.     Heart sounds: Normal heart sounds. No murmur heard.    No friction rub. No gallop.  Pulmonary:     Effort: Pulmonary effort is normal.     Breath sounds: Normal breath sounds. No wheezing, rhonchi or rales.  Musculoskeletal:     Cervical back: Neck supple.  Skin:    General: Skin is warm and dry.     Findings: Rash present.     Comments: Dry, cracked skin on the palms of the ands b/l. Other dry eczematous patches on the torso. Large silvery/white plaque on the posterior calf area on the left.   Neurological:     Mental Status: He is alert and oriented to person, place, and time.  Psychiatric:        Behavior: Behavior normal.   The plan was reviewed with the patient/family, and all questions/concerned were addressed.  It was my pleasure to see Justin [name] today and participate in his care. Please feel free to contact me with any questions or concerns.  Sincerely,  Rexene Alberts, DO Allergy & Immunology  [facility] office: [place] office: [phone]

## 2021-11-16 ENCOUNTER — Encounter: Payer: Self-pay | Admitting: Allergy

## 2021-11-16 ENCOUNTER — Ambulatory Visit: Payer: Medicare Other | Admitting: Allergy

## 2021-11-16 VITALS — BP 124/70 | HR 60 | Temp 98.1°F | Resp 18 | Ht 68.0 in | Wt 165.0 lb

## 2021-11-16 DIAGNOSIS — L409 Psoriasis, unspecified: Secondary | ICD-10-CM

## 2021-11-16 DIAGNOSIS — L301 Dyshidrosis [pompholyx]: Secondary | ICD-10-CM

## 2021-11-16 DIAGNOSIS — R062 Wheezing: Secondary | ICD-10-CM

## 2021-11-16 DIAGNOSIS — L2089 Other atopic dermatitis: Secondary | ICD-10-CM

## 2021-11-16 DIAGNOSIS — J3089 Other allergic rhinitis: Secondary | ICD-10-CM | POA: Diagnosis not present

## 2021-11-16 MED ORDER — EUCRISA 2 % EX OINT: 1.0000 | TOPICAL_OINTMENT | Freq: Two times a day (BID) | CUTANEOUS | 5 refills | Status: DC | PRN

## 2021-11-16 MED ORDER — HYDROXYZINE HCL 25 MG PO TABS: 25.0000 mg | ORAL_TABLET | Freq: Every evening | ORAL | 3 refills | Status: DC | PRN

## 2021-11-16 NOTE — Assessment & Plan Note (Signed)
Itchy rash started in October 2022. This can occur anywhere but more persistent on hands. Saw dermatology and has been using clobetasol with some benefit. Still having daily symptoms. Concerned about allergic triggers.   Today's skin prick testing showed: Positive to grass, weed, ragweed, trees, mold. Negative to common foods.  . Discussed with patient that you do not have to have allergic trigger for this.  . Take zyrtec (cetirizine) 10mg  daily for the itching in the morning. . Take hydroxyzine 25mg  1 hour before bedtime to help with the itching. . See below for proper skin care. . May use clobetasol as needed on the hands. Do not use on the face, neck, armpits or groin area. . Use Eucrisa (crisaborole) 2% ointment twice a day on mild rash flares on the face and body. This is a non-steroid ointment. Samples given.  . Consider starting Dupixent for the hand rash - handout given.  o Our biologics coordinator will be in contact with you regarding cost.

## 2021-11-16 NOTE — Assessment & Plan Note (Signed)
Rash on left leg for 15+ years that is silvery/whitish. Patient follows with dermatology. Clobetasol ineffective.  Clinical appearance concerning for possible psoriasis rash.  Advised patient to follow up with dermatology regarding this.

## 2021-11-16 NOTE — Assessment & Plan Note (Signed)
Rhinoconjunctivitis symptoms in the spring and has been taking a steroid injection annually for the last 20 years.  Was on AIT briefly but stopped due to schedule conflicts. Asymptomatic in [place].   Today's skin prick testing showed: Positive to grass, weed, ragweed, trees, mold.  Start environmental control measures as below.  Use over the counter antihistamines such as Zyrtec (cetirizine), Claritin (loratadine), Allegra (fexofenadine), or Xyzal (levocetirizine) daily as needed. May switch antihistamines every few months.  Recommend starting other medications during the spring time as well such as nasal sprays, eye drops if needed.  Will see him back during the spring.  Consider allergy injections for long term control if above medications do not help the symptoms - handout given.

## 2021-11-16 NOTE — Assessment & Plan Note (Signed)
Wheezing only in the spring and uses albuterol prn with good benefit. . Monitor symptoms. . May use albuterol rescue inhaler 2 puffs or nebulizer every 4 to 6 hours as needed for shortness of breath, chest tightness, coughing, and wheezing. Monitor frequency of use.  . Get spirometry at next visit.

## 2021-11-16 NOTE — Patient Instructions (Addendum)
Today's skin testing showed: Positive to grass, weed, ragweed, trees, mold. Negative to common foods.   Results given.  Rash Hand rash looks like dyshidrotic eczema. Take zyrtec (cetirizine) 10mg  daily for the itching in the morning. Take hydroxyzine 25mg  1 hour before bedtime to help with the itching. See below for proper skin care. May use clobetasol as needed on the hands. Do not use on the face, neck, armpits or groin area. Use Eucrisa (crisaborole) 2% ointment twice a day on mild rash flares on the face and body. This is a non-steroid ointment. Samples given.  If it burns, place the medication in the refrigerator.  Apply a thin layer of moisturizer and then apply the Eucrisa on top of it. Consider starting Dupixent for the hand rash - handout given.  Our biologics coordinator (Tammy) will be in contact with you regarding cost.   Environmental allergies Start environmental control measures as below. Use over the counter antihistamines such as Zyrtec (cetirizine), Claritin (loratadine), Allegra (fexofenadine), or Xyzal (levocetirizine) daily as needed. May switch antihistamines every few months. Recommend starting other medications during the spring time as well such as nasal sprays, eye drops if needed. Will see him back during the spring. Consider allergy injections for long term control if above medications do not help the symptoms - handout given.   Wheezing Monitor symptoms. May use albuterol rescue inhaler 2 puffs or nebulizer every 4 to 6 hours as needed for shortness of breath, chest tightness, coughing, and wheezing. Monitor frequency of use.   Follow up with me in 2 months or sooner if needed.  Follow up with your dermatologist regarding the whitish plaque rash on the legs.   Reducing Pollen Exposure Pollen seasons: trees (spring), grass (summer) and ragweed/weeds (fall). Keep windows closed in your home and car to lower pollen exposure.  Install air conditioning in  the bedroom and throughout the house if possible.  Avoid going out in dry windy days - especially early morning. Pollen counts are highest between 5 - 10 AM and on dry, hot and windy days.  Save outside activities for late afternoon or after a heavy rain, when pollen levels are lower.  Avoid mowing of grass if you have grass pollen allergy. Be aware that pollen can also be transported indoors on people and pets.  Dry your clothes in an automatic dryer rather than hanging them outside where they might collect pollen.  Rinse hair and eyes before bedtime.  Mold Control Mold and fungi can grow on a variety of surfaces provided certain temperature and moisture conditions exist.  Outdoor molds grow on plants, decaying vegetation and soil. The major outdoor mold, Alternaria and Cladosporium, are found in very high numbers during hot and dry conditions. Generally, a late summer - fall peak is seen for common outdoor fungal spores. Rain will temporarily lower outdoor mold spore count, but counts rise rapidly when the rainy period ends. The most important indoor molds are Aspergillus and Penicillium. Dark, humid and poorly ventilated basements are ideal sites for mold growth. The next most common sites of mold growth are the bathroom and the kitchen. Outdoor (Seasonal) Mold Control Use air conditioning and keep windows closed. Avoid exposure to decaying vegetation. Avoid leaf raking. Avoid grain handling. Consider wearing a face mask if working in moldy areas.  Indoor (Perennial) Mold Control  Maintain humidity below 50%. Get rid of mold growth on hard surfaces with water, detergent and, if necessary, 5% bleach (do not mix with other cleaners). Then  dry the area completely. If mold covers an area more than 10 square feet, consider hiring an indoor environmental professional. For clothing, washing with soap and water is best. If moldy items cannot be cleaned and dried, throw them away. Remove sources  e.g. contaminated carpets. Repair and seal leaking roofs or pipes. Using dehumidifiers in damp basements may be helpful, but empty the water and clean units regularly to prevent mildew from forming. All rooms, especially basements, bathrooms and kitchens, require ventilation and cleaning to deter mold and mildew growth. Avoid carpeting on concrete or damp floors, and storing items in damp areas.  Skin care recommendations  Bath time: Always use lukewarm water. AVOID very hot or cold water. Keep bathing time to 5-10 minutes. Do NOT use bubble bath. Use a mild soap and use just enough to wash the dirty areas. Do NOT scrub skin vigorously.  After bathing, pat dry your skin with a towel. Do NOT rub or scrub the skin.  Moisturizers and prescriptions:  ALWAYS apply moisturizers immediately after bathing (within 3 minutes). This helps to lock-in moisture. Use the moisturizer several times a day over the whole body. Good summer moisturizers include: Aveeno, CeraVe, Cetaphil. Good winter moisturizers include: Aquaphor, Vaseline, Cerave, Cetaphil, Eucerin, Vanicream. When using moisturizers along with medications, the moisturizer should be applied about one hour after applying the medication to prevent diluting effect of the medication or moisturize around where you applied the medications. When not using medications, the moisturizer can be continued twice daily as maintenance.  Laundry and clothing: Avoid laundry products with added color or perfumes. Use unscented hypo-allergenic laundry products such as Tide free, Cheer free & gentle, and All free and clear.  If the skin still seems dry or sensitive, you can try double-rinsing the clothes. Avoid tight or scratchy clothing such as wool. Do not use fabric softeners or dyer sheets.

## 2021-11-22 ENCOUNTER — Telehealth: Payer: Self-pay | Admitting: *Deleted

## 2021-11-22 NOTE — Telephone Encounter (Signed)
-----   Message from Ellamae Sia, DO sent at 11/16/2021 12:26 PM EDT ----- Can you start PA for Dupixent for eczema? He said that someone told him it was going to cost $1300/month at one time. Thank you.

## 2021-11-22 NOTE — Telephone Encounter (Signed)
L/m for patient to contact me to discuss patient assistance for Dupixent

## 2021-11-30 NOTE — Telephone Encounter (Signed)
Lm for patient to give me a call

## 2021-12-01 ENCOUNTER — Ambulatory Visit: Payer: Self-pay | Admitting: Allergy

## 2021-12-08 ENCOUNTER — Encounter: Payer: Self-pay | Admitting: *Deleted

## 2021-12-08 NOTE — Telephone Encounter (Signed)
Sent Mychart message to patient to reach out to me

## 2021-12-22 NOTE — Telephone Encounter (Signed)
Never got response back from patient

## 2021-12-22 NOTE — Telephone Encounter (Signed)
Thanks. Noted.

## 2021-12-23 DIAGNOSIS — L2089 Other atopic dermatitis: Secondary | ICD-10-CM | POA: Diagnosis not present

## 2021-12-23 DIAGNOSIS — L301 Dyshidrosis [pompholyx]: Secondary | ICD-10-CM | POA: Diagnosis not present

## 2022-01-17 NOTE — Progress Notes (Deleted)
Follow Up Note  RE: Justin [name] MRN: [id number] DOB: 1955-10-13 Date of Office Visit: 01/18/2022  Referring provider: Galvin Proffer, MD Primary care provider: Galvin Proffer, MD  Chief Complaint: No chief complaint on file.  History of Present Illness: I had the pleasure of seeing Justin [name] for a follow up visit at the [facility] on 01/17/2022. He is a 66 y.o. male, who is being followed for dyshidrotic eczema, allergic rhinitis and wheezing. His previous allergy office visit was on 11/16/2021 with Dr. Selena [name]. Today is a regular follow up visit.  Dyshidrotic eczema Itchy rash started in October 2022. This can occur anywhere but more persistent on hands. Saw dermatology and has been using clobetasol with some benefit. Still having daily symptoms. Concerned about allergic triggers.  Today's skin prick testing showed: Positive to grass, weed, ragweed, trees, mold. Negative to [name] foods.  Discussed with patient that you do not have to have allergic trigger for this.  Take zyrtec (cetirizine) 10mg  daily for the itching in the morning. Take hydroxyzine 25mg  1 hour before bedtime to help with the itching. See below for proper skin care. May use clobetasol as needed on the hands. Do not use on the face, neck, armpits or groin area. Use Eucrisa (crisaborole) 2% ointment twice a day on mild rash flares on the face and body. This is a non-steroid ointment. Samples given.  Consider starting Dupixent for the hand rash - handout given.  Our biologics coordinator will be in contact with you regarding cost.   ? Psoriasis Rash on left leg for 15+ years that is silvery/whitish. Patient follows with dermatology. Clobetasol ineffective. Clinical appearance concerning for possible psoriasis rash. Advised patient to follow up with dermatology regarding this.    Other allergic rhinitis Rhinoconjunctivitis symptoms in the spring and has been taking a steroid injection annually for the  last 20 years.  Was on AIT briefly but stopped due to schedule conflicts. Asymptomatic in .  Today's skin prick testing showed: Positive to grass, weed, ragweed, trees, mold. Start environmental control measures as below. Use over the counter antihistamines such as Zyrtec (cetirizine), Claritin (loratadine), Allegra (fexofenadine), or Xyzal (levocetirizine) daily as needed. May switch antihistamines every few months. Recommend starting other medications during the spring time as well such as nasal sprays, eye drops if needed. Will see him back during the spring. Consider allergy injections for long term control if above medications do not help the symptoms - handout given.    Wheezing Wheezing only in the spring and uses albuterol prn with good benefit. Monitor symptoms. May use albuterol rescue inhaler 2 puffs or nebulizer every 4 to 6 hours as needed for shortness of breath, chest tightness, coughing, and wheezing. Monitor frequency of use.  Get spirometry at next visit.   Return in about 2 months (around 01/16/2022).    Assessment and Plan: Justin [name] is a 66 y.o. male with: No problem-specific Assessment & Plan notes found for this encounter.  No follow-ups on file.  No orders of the defined types were placed in this encounter.  Lab Orders  No laboratory test(s) ordered today    Diagnostics: Spirometry:  Tracings reviewed. His effort: {Blank single:19197::"Good reproducible efforts.","It was hard to get consistent efforts and there is a question as to whether this reflects a maximal maneuver.","Poor effort, data can not be interpreted."} FVC: ***L FEV1: ***L, ***% predicted FEV1/FVC ratio: ***% Interpretation: {Blank single:19197::"Spirometry consistent with mild obstructive disease","Spirometry consistent with moderate obstructive  disease","Spirometry consistent with severe obstructive disease","Spirometry consistent with possible restrictive disease","Spirometry consistent  with mixed obstructive and restrictive disease","Spirometry uninterpretable due to technique","Spirometry consistent with normal pattern","No overt abnormalities noted given today's efforts"}.  Please see scanned spirometry results for details.  Skin Testing: {Blank single:19197::"Select foods","Environmental allergy panel","Environmental allergy panel and select foods","Food allergy panel","None","Deferred due to recent antihistamines use"}. *** Results discussed with patient/family.   Medication List:  Current Outpatient Medications  Medication Sig Dispense Refill   albuterol (PROAIR HFA) 108 (90 Base) MCG/ACT inhaler 1-2 puffs     clobetasol ointment (TEMOVATE) 1.61 % Apply 1 Application topically daily.     Crisaborole (EUCRISA) 2 % OINT Apply 1 Application topically 2 (two) times daily as needed. 60 g 5   DUPIXENT 300 MG/2ML SOPN Inject into the skin.     hydrOXYzine (ATARAX) 25 MG tablet Take 1 tablet (25 mg total) by mouth at bedtime as needed for itching. 30 tablet 3   losartan (COZAAR) 100 MG tablet Take 1 tablet (100 mg total) by mouth daily. 90 tablet 3   simvastatin (ZOCOR) 40 MG tablet Take 1 tablet (40 mg total) by mouth at bedtime. 90 tablet 3   No current facility-administered medications for this visit.   Allergies: No Known Allergies I reviewed his past medical history, social history, family history, and environmental history and no significant changes have been reported from his previous visit.  Review of Systems  Constitutional:  Negative for appetite change, chills, fever and unexpected weight change.  HENT:  Negative for congestion and rhinorrhea.   Eyes:  Negative for itching.  Respiratory:  Negative for cough, chest tightness, shortness of breath and wheezing.   Cardiovascular:  Negative for chest pain.  Gastrointestinal:  Negative for abdominal pain.  Genitourinary:  Negative for difficulty urinating.  Skin:  Positive for rash.  Allergic/Immunologic:  Positive for environmental allergies. Negative for food allergies.  Neurological:  Negative for headaches.    Objective: There were no vitals taken for this visit. There is no height or weight on file to calculate BMI. Physical Exam Vitals and nursing note reviewed.  Constitutional:      Appearance: Normal appearance. He is well-developed.  HENT:     Head: Normocephalic and atraumatic.     Right Ear: Tympanic membrane and external ear normal.     Left Ear: Tympanic membrane and external ear normal.     Nose: Nose normal.     Mouth/Throat:     Mouth: Mucous membranes are moist.     Pharynx: Oropharynx is clear.  Eyes:     Conjunctiva/sclera: Conjunctivae normal.  Cardiovascular:     Rate and Rhythm: Normal rate and regular rhythm.     Heart sounds: Normal heart sounds. No murmur heard.    No friction rub. No gallop.  Pulmonary:     Effort: Pulmonary effort is normal.     Breath sounds: Normal breath sounds. No wheezing, rhonchi or rales.  Musculoskeletal:     Cervical back: Neck supple.  Skin:    General: Skin is warm and dry.     Findings: Rash present.     Comments: Dry, cracked skin on the palms of the ands b/l. Other dry eczematous patches on the torso. Large silvery/white plaque on the posterior calf area on the left.   Neurological:     Mental Status: He is alert and oriented to person, place, and time.  Psychiatric:        Behavior: Behavior normal.  Previous notes and tests were reviewed. The plan was reviewed with the patient/family, and all questions/concerned were addressed.  It was my pleasure to see Stark today and participate in his care. Please feel free to contact me with any questions or concerns.  Sincerely,  Wyline Mood, DO Allergy & Immunology  [facility] office: [phone] [facility] office: [phone]

## 2022-01-18 ENCOUNTER — Ambulatory Visit: Payer: Medicare Other | Admitting: Allergy

## 2022-01-27 DIAGNOSIS — R350 Frequency of micturition: Secondary | ICD-10-CM | POA: Diagnosis not present

## 2022-01-29 ENCOUNTER — Ambulatory Visit
Admission: RE | Admit: 2022-01-29 | Discharge: 2022-01-29 | Disposition: A | Discharge status: Home / Self Care | Payer: Medicare Other | Source: Ambulatory Visit | Attending: Emergency Medicine | Admitting: Emergency Medicine

## 2022-01-29 VITALS — BP 148/79 | HR 96 | Temp 98.3°F | Resp 16

## 2022-01-29 DIAGNOSIS — L301 Dyshidrosis [pompholyx]: Secondary | ICD-10-CM

## 2022-01-29 NOTE — ED Provider Notes (Signed)
[facility]    CSN: [id number] Arrival date & time: 01/29/22  1453    HISTORY   Chief Complaint  Patient presents with   Abscess   HPI Justin [name] is a pleasant, 66 y.o. male who presents to urgent care today. Patient complains of a large blister on the palm of his right hand along with multiple other smaller blisters on the palm of both of his hands.  Patient has a history of dyshidrotic eczema.  Patient states the large blister is very painful.  Patient states he has used [place] in the past and the rash got better.  States not currently using at this time.  Patient states he has been offered Dupixent for treatment but states he is not currently using this.    Past Medical History:  Diagnosis Date   Allergic rhinitis    Anemia    B-12 deficiency   Arthritis    Diabetes mellitus without complication (HCC)    Discoloration of skin    L LEG   Elevated uric acid in blood    GERD (gastroesophageal reflux disease)    History of stomach ulcers    HTN (hypertension)    Hyperlipidemia    Major depressive disorder, recurrent (HCC)    Seasonal allergies    Vitamin D deficiency    Patient Active Problem List   Diagnosis Date Noted   Dyshidrotic eczema 11/16/2021   Wheezing 11/16/2021   ? Psoriasis 11/16/2021   Family history of early CAD 07/30/2018   Obstructive sleep apnea on CPAP 12/24/2013   Expected blood loss anemia 07/03/2013   Overweight (BMI 25.0-29.9) 07/03/2013   Hypokalemia 07/03/2013   S/P right THA, AA 07/02/2013   S/P total hip arthroplasty 07/02/2013   Flatulence, eructation, and gas pain 11/05/2009   DIARRHEA 11/05/2009   IMPAIRED GLUCOSE TOLERANCE 02/28/2008   Hypercholesteremia 02/28/2008   Essential hypertension 02/28/2008   Other allergic rhinitis 02/28/2008   GERD 02/28/2008   Past Surgical History:  Procedure Laterality Date   CATARACT EXTRACTION W/ INTRAOCULAR LENS IMPLANT     COLONOSCOPY  05-2008   ESOPHAGOGASTRODUODENOSCOPY   05-2008   TOTAL HIP ARTHROPLASTY Right 07/02/2013   Procedure: RIGHT TOTAL HIP ARTHROPLASTY ANTERIOR APPROACH;  Surgeon: Mauri Pole, MD;  Location: [facility];  Service: Orthopedics;  Laterality: Right;    Home Medications    Prior to Admission medications   Medication Sig Start Date End Date Taking? Authorizing Provider  albuterol (PROAIR HFA) 108 (90 Base) MCG/ACT inhaler 1-2 puffs    [provider]  clobetasol ointment (TEMOVATE) 2.68 % Apply 1 Application topically daily. 08/19/21   [provider]  Crisaborole (EUCRISA) 2 % OINT Apply 1 Application topically 2 (two) times daily as needed. 11/16/21   Garnet Sierras, DO  DUPIXENT 300 MG/2ML SOPN Inject into the skin. 10/13/21   [provider]  hydrOXYzine (ATARAX) 25 MG tablet Take 1 tablet (25 mg total) by mouth at bedtime as needed for itching. 11/16/21   Garnet Sierras, DO  losartan (COZAAR) 100 MG tablet Take 1 tablet (100 mg total) by mouth daily. 01/21/19   Adrian Prows, MD  simvastatin (ZOCOR) 40 MG tablet Take 1 tablet (40 mg total) by mouth at bedtime. 01/21/19   Adrian Prows, MD    Family History Family History  Problem Relation Age of Onset   Heart attack Father 19   Hypertension Mother    Social History Social History   Tobacco Use   Smoking  status: Never   Smokeless tobacco: Never  Substance Use Topics   Alcohol use: Yes    Comment: RARE   Drug use: No   Allergies   Patient has no known allergies.  Review of Systems Review of Systems Pertinent findings revealed after performing a 14 point review of systems has been noted in the history of present illness.  Physical Exam Triage Vital Signs ED Triage Vitals  Enc Vitals Group     BP 02/05/21 0827 (!) 147/82     Pulse Rate 02/05/21 0827 72     Resp 02/05/21 0827 18     Temp 02/05/21 0827 98.3 F (36.8 C)     Temp Source 02/05/21 0827 Oral     SpO2 02/05/21 0827 98 %     Weight --      Height --      Head Circumference --      Peak Flow --       Pain Score 02/05/21 0826 5     Pain Loc --      Pain Edu? --      Excl. in GC? --   No data found.  Updated Vital Signs BP (!) 148/79 (BP Location: Left Arm)   Pulse 96   Temp 98.3 F (36.8 C) (Oral)   Resp 16   SpO2 96%   Physical Exam Vitals and nursing note reviewed.  Constitutional:      General: He is not in acute distress.    Appearance: Normal appearance. He is normal weight. He is not ill-appearing.  HENT:     Head: Normocephalic and atraumatic.  Eyes:     Extraocular Movements: Extraocular movements intact.     Conjunctiva/sclera: Conjunctivae normal.     Pupils: Pupils are equal, round, and reactive to light.  Cardiovascular:     Rate and Rhythm: Normal rate and regular rhythm.  Pulmonary:     Effort: Pulmonary effort is normal.     Breath sounds: Normal breath sounds.  Musculoskeletal:        General: Normal range of motion.     Cervical back: Normal range of motion and neck supple.  Skin:    General: Skin is warm and dry.     Findings: Lesion (Fluid-filled blister approximately 2 cm in diameter on palm of right hand and along with multiple smaller similar lesions on palms of both hands.) present.  Neurological:     General: No focal deficit present.     Mental Status: He is alert and oriented to person, place, and time. Mental status is at baseline.  Psychiatric:        Mood and Affect: Mood normal.        Behavior: Behavior normal.        Thought Content: Thought content normal.        Judgment: Judgment normal.     Visual Acuity Right Eye Distance:   Left Eye Distance:   Bilateral Distance:    Right Eye Near:   Left Eye Near:    Bilateral Near:     UC Couse / Diagnostics / Procedures:     Radiology No results found.  Procedures Procedures (including critical care time) EKG  Pending results:  Labs Reviewed - No data to display  Medications Ordered in UC: Medications - No data to display  UC Diagnoses / Final Clinical  Impressions(s)   I have reviewed the triage vital signs and the nursing notes.  Pertinent labs & imaging results that  were available during my care of the patient were reviewed by me and considered in my medical decision making (see chart for details).    Final diagnoses:  Pompholyx eczema   Patient advised to continue Eucrisa, avoid bursting the blister, consider starting Dupixent and follow-up with allergy and asthma specialist.  ED Prescriptions   None    PDMP not reviewed this encounter.  Pending results:  Labs Reviewed - No data to display  Discharge Instructions:   Discharge Instructions      Please resume Eucrisa and follow-up with the allergy and asthma center in [facility] to discuss starting Dupixent for your dyshidrotic eczema.      Disposition Upon Discharge:  Condition: stable for discharge home  Patient presented with an acute illness with associated systemic symptoms and significant discomfort requiring urgent management. In my opinion, this is a condition that a prudent lay person (someone who possesses an average knowledge of health and medicine) may potentially expect to result in complications if not addressed urgently such as respiratory distress, impairment of bodily function or dysfunction of bodily organs.   Routine symptom specific, illness specific and/or disease specific instructions were discussed with the patient and/or caregiver at length.   As such, the patient has been evaluated and assessed, work-up was performed and treatment was provided in alignment with urgent care protocols and evidence based medicine.  Patient/parent/caregiver has been advised that the patient may require follow up for further testing and treatment if the symptoms continue in spite of treatment, as clinically indicated and appropriate.  Patient/parent/caregiver has been advised to return to the [facility] or PCP if no better; to PCP or the Emergency Department if new signs and  symptoms develop, or if the current signs or symptoms continue to change or worsen for further workup, evaluation and treatment as clinically indicated and appropriate  The patient will follow up with their current PCP if and as advised. If the patient does not currently have a PCP we will assist them in obtaining one.   The patient may need specialty follow up if the symptoms continue, in spite of conservative treatment and management, for further workup, evaluation, consultation and treatment as clinically indicated and appropriate.   Patient/parent/caregiver verbalized understanding and agreement of plan as discussed.  All questions were addressed during visit.  Please see discharge instructions below for further details of plan.  This office note has been dictated using Teaching laboratory technician.  Unfortunately, this method of dictation can sometimes lead to typographical or grammatical errors.  I apologize for your inconvenience in advance if this occurs.  Please do not hesitate to reach out to me if clarification is needed.      Theadora Rama Scales, PA-C 01/29/22 1521

## 2022-01-29 NOTE — ED Triage Notes (Signed)
Triaged by provider  

## 2022-01-29 NOTE — Discharge Instructions (Addendum)
Please resume Eucrisa and follow-up with the allergy and asthma center in [facility] to discuss starting [place] for your dyshidrotic eczema.

## 2022-01-31 ENCOUNTER — Ambulatory Visit (HOSPITAL_COMMUNITY)
Admission: RE | Admit: 2022-01-31 | Discharge: 2022-01-31 | Disposition: A | Discharge status: Home / Self Care | Payer: Medicare Other | Source: Ambulatory Visit | Attending: Internal Medicine | Admitting: Internal Medicine

## 2022-01-31 ENCOUNTER — Encounter (HOSPITAL_COMMUNITY): Payer: Self-pay

## 2022-01-31 VITALS — BP 143/81 | HR 73 | Temp 97.9°F | Resp 17

## 2022-01-31 DIAGNOSIS — S60521D Blister (nonthermal) of right hand, subsequent encounter: Secondary | ICD-10-CM

## 2022-01-31 NOTE — Discharge Instructions (Addendum)
Stop using the crisaborole (eucrisa) to the right hand; you may continue use to the left hand.  Do not pop your blister. This can cause infection. Keep your blister clean and dry. This helps to prevent infection. Protect the area where your blister has formed   Check your blister every day for signs of infection.  Redness, swelling, or pain. More fluid or blood. Warmth. Pus or a bad smell.  Follow-up with your dermatologist on Monday as scheduled

## 2022-01-31 NOTE — ED Triage Notes (Signed)
Pt reports a "blister" on right hand x 3/4 days. States he has eczema and has been using an ointment. Believes the ointment may have caused it.

## 2022-01-31 NOTE — ED Provider Notes (Signed)
[facility]    CSN: [id number] Arrival date & time: 01/31/22  1739      History   Chief Complaint Chief Complaint  Patient presents with   Blister   1830 Appt    HPI Justin [name] is a 66 y.o. male.   Subjective:   Justin [name] is a 66 y.o. male who presents today for wound check. Patient has a large blister to the palm of the right hand. This has been present for the past couple of weeks. Patient has a history of eczema and on eucrisa. He reports that this has happened before. He typically stops the [place] and the symptoms improve; however, this has not happened this time. He reports mild pain at the site of the blister. No surrounding redness or drainage. No fevers. He was seen at [facility] 2 days ago for the same. No change in symptoms since that evaluation. Patient thinks he needs antibiotics. He has an appointment scheduled with his dermatologist in 1 week from today.        Past Medical History:  Diagnosis Date   Allergic rhinitis    Anemia    B-12 deficiency   Arthritis    Diabetes mellitus without complication (HCC)    Discoloration of skin    L LEG   Elevated uric acid in blood    GERD (gastroesophageal reflux disease)    History of stomach ulcers    HTN (hypertension)    Hyperlipidemia    Major depressive disorder, recurrent (HCC)    Seasonal allergies    Vitamin D deficiency     Patient Active Problem List   Diagnosis Date Noted   Dyshidrotic eczema 11/16/2021   Wheezing 11/16/2021   ? Psoriasis 11/16/2021   Family history of early CAD 07/30/2018   Obstructive sleep apnea on CPAP 12/24/2013   Expected blood loss anemia 07/03/2013   Overweight (BMI 25.0-29.9) 07/03/2013   Hypokalemia 07/03/2013   S/P right THA, AA 07/02/2013   S/P total hip arthroplasty 07/02/2013   Flatulence, eructation, and gas pain 11/05/2009   DIARRHEA 11/05/2009   IMPAIRED GLUCOSE TOLERANCE 02/28/2008   Hypercholesteremia 02/28/2008   Essential  hypertension 02/28/2008   Other allergic rhinitis 02/28/2008   GERD 02/28/2008    Past Surgical History:  Procedure Laterality Date   CATARACT EXTRACTION W/ INTRAOCULAR LENS IMPLANT     COLONOSCOPY  05-2008   ESOPHAGOGASTRODUODENOSCOPY  05-2008   TOTAL HIP ARTHROPLASTY Right 07/02/2013   Procedure: RIGHT TOTAL HIP ARTHROPLASTY ANTERIOR APPROACH;  Surgeon: Shelda Pal, MD;  Location: [facility];  Service: Orthopedics;  Laterality: Right;       Home Medications    Prior to Admission medications   Medication Sig Start Date End Date Taking? Authorizing Provider  albuterol (PROAIR HFA) 108 (90 Base) MCG/ACT inhaler 1-2 puffs    [provider]  clobetasol ointment (TEMOVATE) 0.05 % Apply 1 Application topically daily. 08/19/21   [provider]  Crisaborole (EUCRISA) 2 % OINT Apply 1 Application topically 2 (two) times daily as needed. 11/16/21   Ellamae Sia, DO  DUPIXENT 300 MG/2ML SOPN Inject into the skin. 10/13/21   [provider]  hydrOXYzine (ATARAX) 25 MG tablet Take 1 tablet (25 mg total) by mouth at bedtime as needed for itching. 11/16/21   Ellamae Sia, DO  losartan (COZAAR) 100 MG tablet Take 1 tablet (100 mg total) by mouth daily. 01/21/19   Yates Decamp, MD  simvastatin (ZOCOR) 40 MG  tablet Take 1 tablet (40 mg total) by mouth at bedtime. 01/21/19   Yates Decamp, MD    Family History Family History  Problem Relation Age of Onset   Heart attack Father 22   Hypertension Mother     Social History Social History   Tobacco Use   Smoking status: Never   Smokeless tobacco: Never  Substance Use Topics   Alcohol use: Yes    Comment: RARE   Drug use: No     Allergies   Patient has no known allergies.   Review of Systems Review of Systems  Constitutional:  Negative for fever.  Skin:  Positive for wound.     Physical Exam Triage Vital Signs ED Triage Vitals  Enc Vitals Group     BP 01/31/22 1857 (!) 143/81     Pulse Rate 01/31/22 1857 73      Resp 01/31/22 1857 17     Temp 01/31/22 1857 97.9 F (36.6 C)     Temp Source 01/31/22 1857 Oral     SpO2 01/31/22 1857 96 %     Weight --      Height --      Head Circumference --      Peak Flow --      Pain Score 01/31/22 1858 0     Pain Loc --      Pain Edu? --      Excl. in GC? --    No data found.  Updated Vital Signs BP (!) 143/81 (BP Location: Left Arm)   Pulse 73   Temp 97.9 F (36.6 C) (Oral)   Resp 17   SpO2 96%   Visual Acuity Right Eye Distance:   Left Eye Distance:   Bilateral Distance:    Right Eye Near:   Left Eye Near:    Bilateral Near:     Physical Exam Constitutional:      Appearance: Normal appearance.  HENT:     Head: Normocephalic.     Mouth/Throat:     Mouth: Mucous membranes are moist.  Eyes:     Conjunctiva/sclera: Conjunctivae normal.  Cardiovascular:     Rate and Rhythm: Normal rate.  Pulmonary:     Effort: Pulmonary effort is normal.  Musculoskeletal:        General: Normal range of motion.     Cervical back: Normal range of motion and neck supple.  Skin:    General: Skin is warm and dry.     Comments: Large blister noted to the palmar aspect of the right hand. See picture below   Neurological:     General: No focal deficit present.     Mental Status: He is alert and oriented to person, place, and time.  Psychiatric:        Mood and Affect: Mood normal.        Behavior: Behavior normal.       UC Treatments / Results  Labs (all labs ordered are listed, but only abnormal results are displayed) Labs Reviewed - No data to display  EKG   Radiology No results found.  Procedures Procedures (including critical care time)  Medications Ordered in UC Medications - No data to display  Initial Impression / Assessment and Plan / UC Course  I have reviewed the triage vital signs and the nursing notes.  Pertinent labs & imaging results that were available during my care of the patient were reviewed by me and considered in  my medical decision making (see  chart for details).    66 year old male with history of eczema presents with a large blister to the palmar aspect of the right hand.  Blister has been there for a couple of weeks now.  He endorses mild pain but no redness, drainage or fevers.  No indication of infection at this time. Advised patient that blisters should be protected and not drained at this time.  Wound care and indications for immediate follow-up discussed.  Advised to continue to not use the eczema ointment on the site until seen by rheumatology next week.    Today's evaluation has revealed no signs of a dangerous process. Discussed diagnosis with patient and/or guardian. Patient and/or guardian aware of their diagnosis, possible red flag symptoms to watch out for and need for close follow up. Patient and/or guardian understands verbal and written discharge instructions. Patient and/or guardian comfortable with plan and disposition.  Patient and/or guardian has a clear mental status at this time, good insight into illness (after discussion and teaching) and has clear judgment to make decisions regarding their care  Documentation was completed with the aid of voice recognition software. Transcription may contain typographical errors. Final Clinical Impressions(s) / UC Diagnoses   Final diagnoses:  Blister of right hand, subsequent encounter     Discharge Instructions      Stop using the crisaborole (eucrisa) to the right hand; you may continue use to the left hand.  Do not pop your blister. This can cause infection. Keep your blister clean and dry. This helps to prevent infection. Protect the area where your blister has formed   Check your blister every day for signs of infection.  Redness, swelling, or pain. More fluid or blood. Warmth. Pus or a bad smell.  Follow-up with your dermatologist on Monday as scheduled      ED Prescriptions   None    PDMP not reviewed this encounter.    Enrique Sack, [place] 01/31/22 2021

## 2022-04-01 DIAGNOSIS — J069 Acute upper respiratory infection, unspecified: Secondary | ICD-10-CM | POA: Diagnosis not present

## 2022-04-01 DIAGNOSIS — U071 COVID-19: Secondary | ICD-10-CM | POA: Diagnosis not present

## 2022-05-17 DIAGNOSIS — M79671 Pain in right foot: Secondary | ICD-10-CM | POA: Diagnosis not present

## 2022-06-21 DIAGNOSIS — R0982 Postnasal drip: Secondary | ICD-10-CM | POA: Diagnosis not present

## 2022-06-21 DIAGNOSIS — Z9109 Other allergy status, other than to drugs and biological substances: Secondary | ICD-10-CM | POA: Diagnosis not present

## 2022-06-21 DIAGNOSIS — G4733 Obstructive sleep apnea (adult) (pediatric): Secondary | ICD-10-CM | POA: Diagnosis not present

## 2022-06-21 DIAGNOSIS — R0981 Nasal congestion: Secondary | ICD-10-CM | POA: Diagnosis not present

## 2022-06-21 DIAGNOSIS — J342 Deviated nasal septum: Secondary | ICD-10-CM | POA: Diagnosis not present

## 2022-06-21 DIAGNOSIS — K219 Gastro-esophageal reflux disease without esophagitis: Secondary | ICD-10-CM | POA: Diagnosis not present

## 2022-06-29 DIAGNOSIS — K5900 Constipation, unspecified: Secondary | ICD-10-CM | POA: Diagnosis not present

## 2022-06-29 DIAGNOSIS — Z79899 Other long term (current) drug therapy: Secondary | ICD-10-CM | POA: Diagnosis not present

## 2022-06-29 DIAGNOSIS — Z0000 Encounter for general adult medical examination without abnormal findings: Secondary | ICD-10-CM | POA: Diagnosis not present

## 2022-06-29 DIAGNOSIS — K219 Gastro-esophageal reflux disease without esophagitis: Secondary | ICD-10-CM | POA: Diagnosis not present

## 2022-06-29 DIAGNOSIS — E038 Other specified hypothyroidism: Secondary | ICD-10-CM | POA: Diagnosis not present

## 2022-06-29 DIAGNOSIS — E1165 Type 2 diabetes mellitus with hyperglycemia: Secondary | ICD-10-CM | POA: Diagnosis not present

## 2022-06-29 DIAGNOSIS — I10 Essential (primary) hypertension: Secondary | ICD-10-CM | POA: Diagnosis not present

## 2022-06-29 DIAGNOSIS — E782 Mixed hyperlipidemia: Secondary | ICD-10-CM | POA: Diagnosis not present

## 2022-06-29 DIAGNOSIS — D518 Other vitamin B12 deficiency anemias: Secondary | ICD-10-CM | POA: Diagnosis not present

## 2022-06-29 DIAGNOSIS — E559 Vitamin D deficiency, unspecified: Secondary | ICD-10-CM | POA: Diagnosis not present

## 2022-07-01 DIAGNOSIS — R0989 Other specified symptoms and signs involving the circulatory and respiratory systems: Secondary | ICD-10-CM | POA: Diagnosis not present

## 2022-07-01 DIAGNOSIS — I6523 Occlusion and stenosis of bilateral carotid arteries: Secondary | ICD-10-CM | POA: Diagnosis not present

## 2022-07-05 DIAGNOSIS — I70223 Atherosclerosis of native arteries of extremities with rest pain, bilateral legs: Secondary | ICD-10-CM | POA: Diagnosis not present

## 2022-07-08 DIAGNOSIS — E042 Nontoxic multinodular goiter: Secondary | ICD-10-CM | POA: Diagnosis not present

## 2022-07-08 DIAGNOSIS — R221 Localized swelling, mass and lump, neck: Secondary | ICD-10-CM | POA: Diagnosis not present

## 2022-07-12 DIAGNOSIS — I77811 Abdominal aortic ectasia: Secondary | ICD-10-CM | POA: Diagnosis not present

## 2022-07-15 DIAGNOSIS — R2242 Localized swelling, mass and lump, left lower limb: Secondary | ICD-10-CM | POA: Diagnosis not present

## 2022-07-19 DIAGNOSIS — R590 Localized enlarged lymph nodes: Secondary | ICD-10-CM | POA: Diagnosis not present

## 2022-07-23 ENCOUNTER — Other Ambulatory Visit: Payer: Self-pay | Admitting: Internal Medicine

## 2022-07-23 DIAGNOSIS — I6521 Occlusion and stenosis of right carotid artery: Secondary | ICD-10-CM

## 2022-09-01 ENCOUNTER — Inpatient Hospital Stay: Admission: RE | Admit: 2022-09-01 | Payer: Medicare Other | Source: Ambulatory Visit

## 2022-11-07 ENCOUNTER — Other Ambulatory Visit: Payer: Medicare Other

## 2022-11-08 ENCOUNTER — Ambulatory Visit
Admission: RE | Admit: 2022-11-08 | Discharge: 2022-11-08 | Disposition: A | Discharge status: Home / Self Care | Payer: Medicare Other | Source: Ambulatory Visit | Attending: Internal Medicine | Admitting: Internal Medicine

## 2022-11-08 DIAGNOSIS — I6521 Occlusion and stenosis of right carotid artery: Secondary | ICD-10-CM

## 2022-12-07 DIAGNOSIS — I700 Atherosclerosis of aorta: Secondary | ICD-10-CM | POA: Diagnosis not present

## 2022-12-07 DIAGNOSIS — I10 Essential (primary) hypertension: Secondary | ICD-10-CM | POA: Diagnosis not present

## 2022-12-07 DIAGNOSIS — E559 Vitamin D deficiency, unspecified: Secondary | ICD-10-CM | POA: Diagnosis not present

## 2022-12-07 DIAGNOSIS — D518 Other vitamin B12 deficiency anemias: Secondary | ICD-10-CM | POA: Diagnosis not present

## 2022-12-07 DIAGNOSIS — K219 Gastro-esophageal reflux disease without esophagitis: Secondary | ICD-10-CM | POA: Diagnosis not present

## 2022-12-07 DIAGNOSIS — E1165 Type 2 diabetes mellitus with hyperglycemia: Secondary | ICD-10-CM | POA: Diagnosis not present

## 2022-12-07 DIAGNOSIS — R0602 Shortness of breath: Secondary | ICD-10-CM | POA: Diagnosis not present

## 2022-12-07 DIAGNOSIS — E782 Mixed hyperlipidemia: Secondary | ICD-10-CM | POA: Diagnosis not present

## 2022-12-07 DIAGNOSIS — E038 Other specified hypothyroidism: Secondary | ICD-10-CM | POA: Diagnosis not present

## 2022-12-07 DIAGNOSIS — Z79899 Other long term (current) drug therapy: Secondary | ICD-10-CM | POA: Diagnosis not present

## 2022-12-07 LAB — LAB REPORT - SCANNED
A1c: 7.2
EGFR (Non-African Amer.): 118

## 2022-12-08 DIAGNOSIS — R011 Cardiac murmur, unspecified: Secondary | ICD-10-CM | POA: Diagnosis not present

## 2022-12-09 DIAGNOSIS — R1010 Upper abdominal pain, unspecified: Secondary | ICD-10-CM | POA: Diagnosis not present

## 2022-12-22 DIAGNOSIS — I700 Atherosclerosis of aorta: Secondary | ICD-10-CM | POA: Diagnosis not present

## 2022-12-22 DIAGNOSIS — E038 Other specified hypothyroidism: Secondary | ICD-10-CM | POA: Diagnosis not present

## 2022-12-22 DIAGNOSIS — I10 Essential (primary) hypertension: Secondary | ICD-10-CM | POA: Diagnosis not present

## 2022-12-22 DIAGNOSIS — E559 Vitamin D deficiency, unspecified: Secondary | ICD-10-CM | POA: Diagnosis not present

## 2022-12-22 DIAGNOSIS — E782 Mixed hyperlipidemia: Secondary | ICD-10-CM | POA: Diagnosis not present

## 2022-12-22 DIAGNOSIS — E1165 Type 2 diabetes mellitus with hyperglycemia: Secondary | ICD-10-CM | POA: Diagnosis not present

## 2022-12-22 DIAGNOSIS — D518 Other vitamin B12 deficiency anemias: Secondary | ICD-10-CM | POA: Diagnosis not present

## 2022-12-31 DIAGNOSIS — M79672 Pain in left foot: Secondary | ICD-10-CM | POA: Diagnosis not present

## 2023-01-12 ENCOUNTER — Encounter: Payer: Self-pay | Admitting: Cardiology

## 2023-01-12 ENCOUNTER — Ambulatory Visit: Payer: Medicare Other | Attending: Cardiology | Admitting: Cardiology

## 2023-01-12 VITALS — BP 130/88 | HR 105 | Resp 16 | Ht 68.0 in | Wt 167.4 lb

## 2023-01-12 DIAGNOSIS — I10 Essential (primary) hypertension: Secondary | ICD-10-CM

## 2023-01-12 DIAGNOSIS — R0609 Other forms of dyspnea: Secondary | ICD-10-CM | POA: Diagnosis not present

## 2023-01-12 DIAGNOSIS — E1165 Type 2 diabetes mellitus with hyperglycemia: Secondary | ICD-10-CM

## 2023-01-12 DIAGNOSIS — E7800 Pure hypercholesterolemia, unspecified: Secondary | ICD-10-CM | POA: Diagnosis not present

## 2023-01-12 MED ORDER — EMPAGLIFLOZIN 10 MG PO TABS: 10.0000 mg | ORAL_TABLET | Freq: Every day | ORAL | 1 refills | Status: AC

## 2023-01-12 MED ORDER — EMPAGLIFLOZIN 10 MG PO TABS: 10.0000 mg | ORAL_TABLET | Freq: Every day | ORAL | Status: AC

## 2023-01-12 NOTE — Patient Instructions (Addendum)
Medication Instructions:  Your physician has recommended you make the following change in your medication:   1) START empagliflozin (Jardiance) 10 mg once daily (take in the mornings)  *If you need a refill on your cardiac medications before your next appointment, please call your pharmacy*  Lab Work: None ordered today.  Testing/Procedures: None ordered today.  Follow-Up: At [facility], you and your health needs are our priority.  As part of our continuing mission to provide you with exceptional heart care, we have created designated Provider Care Teams.  These Care Teams include your primary Cardiologist (physician) and Advanced Practice Providers (APPs -  Physician Assistants and Nurse Practitioners) who all work together to provide you with the care you need, when you need it.  Your next appointment:   As needed  The format for your next appointment:   In Person  Provider:   Yates Decamp, MD {

## 2023-01-12 NOTE — Progress Notes (Signed)
Cardiology Office Note:  .   Date:  01/12/2023  ID:  Justin [name], DOB 1956/02/06, MRN [id number] PCP: Galvin Proffer, MD  [facility] Providers Cardiologist:  Yates Decamp, MD    History of Present Illness: Justin [name] is a 67 y.o. male [place] male with hypertension, GERD, family history of early CAD, hyperglycemia and hyperlipidemia.  He was last seen by me 4 years ago and has had a negative pharmacologic nuclear stress test and essentially a normal echocardiogram with mild diastolic dysfunction.  He is now referred back for evaluation of chest pain.  Discussed the use of AI scribe software for clinical note transcription with the patient, who gave verbal consent to proceed.  History of Present Illness   Justin [name], a patient with a history of diabetes and hyperlipidemia, presents for a follow-up visit after a gap of four years. The patient was recently recommended a CT angiogram of the neck arteries by another physician, but wanted to consult with the current provider before proceeding. The patient reports feeling tired, especially when climbing stairs, but does not report any other symptoms. The patient's recent lab reports indicate slightly elevated liver enzymes and an A1c of 7.2%, suggesting poorly controlled diabetes. The patient is currently on cholesterol medication and has concerns about his low HDL levels. The patient also mentions a history of fatty liver and is concerned about his slightly elevated liver enzymes. The patient denies any tobacco use and reports walking for about 25 minutes daily, achieving approximately 8,000 to 10,000 steps. The patient also reports occasional alcohol consumption, about 60 mL three days a week.       Review of Systems  Cardiovascular:  Positive for dyspnea on exertion (very mild when he climbs stairs). Negative for chest pain and leg swelling.    Physical Exam Neck:     Vascular: No carotid bruit or JVD.  Cardiovascular:     Rate  and Rhythm: Normal rate and regular rhythm.     Pulses: Intact distal pulses.     Heart sounds: Normal heart sounds. No murmur heard.    No gallop.  Pulmonary:     Effort: Pulmonary effort is normal.     Breath sounds: Normal breath sounds.  Abdominal:     General: Bowel sounds are normal.     Palpations: Abdomen is soft.  Musculoskeletal:     Right lower leg: No edema.     Left lower leg: No edema.    Risk Assessment/Calculations:     External Labs:  Labs 12/07/2022:  Hb 14.3/HCT 44.1, platelets 233, microcytic indicis.  A1c 7.2%.  TSH normal at 1.56.  FT4 minimally elevated at 1.20 (0.61-1.12).  Normal free T3 at 2.82.  Vitamin D24.5  Anemia panel reveals normal iron levels, B12 and folate.  Sodium 139, potassium 3.2, BUN 11, creatinine 0.71, EGFR 118 mL.  LFTs revealed mild elevation in ALT at 44 and AST at 38.  Serum bilirubin normal.  Total cholesterol 129, triglycerides 52, HDL 36, LDL 79   Physical Exam:   VS:  BP 130/88 (BP Location: Left Arm, Patient Position: Sitting, Cuff Size: Normal)   Pulse (!) 105   Resp 16   Ht 5\' 8"  (1.727 m)   Wt 167 lb 6.4 oz (75.9 kg)   BMI 25.45 kg/m    Wt Readings from Last 3 Encounters:  01/12/23 167 lb 6.4 oz (75.9 kg)  11/16/21 165 lb (74.8 kg)  07/04/19 141 lb  6 oz (64.1 kg)     Neck:     Vascular: No carotid bruit or JVD.  Pulmonary:     Effort: Pulmonary effort is normal.     Breath sounds: Normal breath sounds.  Cardiovascular:     Normal rate. Regular rhythm. Normal heart sounds.     No gallop.   Pulses:    Intact distal pulses.  Edema:    Pretibial: no edema of the left pretibial area and no edema of the right pretibial area. Abdominal:     General: Bowel sounds are normal.     Palpations: Abdomen is soft.      Studies Reviewed: Marland Kitchen   EKG Interpretation Date/Time:  Thursday January 12 2023 09:53:24 EDT Ventricular Rate:  79 PR Interval:  168 QRS Duration:  78 QT Interval:  370 QTC Calculation: 424 R  Axis:   34  Text Interpretation: EKG 01/12/2023: Normal sinus rhythm and sinus arrhythmia at the rate of 79 bpm, otherwise normal EKG.  No significant change from prior EKG from PCP. Confirmed by Delrae Rend [phone]) on 01/12/2023 10:03:10 AM    EKG 12/07/2022 revealing normal sinus rhythm and sinus arrhythmia, no evidence of ischemia, normal EKG.  Lexiscan Sestamibi Stress Test 01/07/2019: Stress EKG is non-diagnostic, as this is pharmacological stress test. Myocardial perfusion imaging is normal. Left ventricular ejection fraction is  75% with normal wall motion. Low risk study. No previous exam available for comparison  Echocardiogram 08/29/2018:  Normal LV systolic function with EF 62%. Left ventricle cavity is normal  in size. Moderate concentric hypertrophy of the left ventricle. Normal  global wall motion. Doppler evidence of grade I (impaired) diastolic  dysfunction, normal LAP. Calculated EF 62%.  Mild tricuspid regurgitation. Estimated pulmonary artery systolic pressure 29 mmHg.  No significant change compared to previous study on 08/30/2016.   External echocardiogram 12/08/2022: Normal LV systolic function and size, EF 60 to 65% without wall motion abnormality.  Normal diastolic function. Mildly calcified aortic valve leaflets with trace to mild aortic regurgitation.  Trileaflet aortic valve. Atrial septal aneurysm is noted without evidence of interatrial shunting. Normal RV size and function.   ASSESSMENT AND PLAN: .      ICD-10-CM   1. Dyspnea on exertion  R06.09 EKG 12-Lead    2. Primary hypertension  I10     3. Pure hypercholesterolemia  E78.00     4. Type 2 diabetes mellitus with hyperglycemia, without long-term current use of insulin (HCC)  E11.65 empagliflozin (JARDIANCE) 10 MG TABS tablet      Assessment and Plan  Dyspnea on exertion is related to probably mild deconditioning and age-related changes.  He exercises and walks for 10,000 steps a day without any  dyspnea.  Advised him to continue to increase his physical activity and actually use the stairs at least 3-4 times a day.  Otherwise he develops worsening dyspnea associated chest pain, no further evaluation is indicated.    Type 2 Diabetes Mellitus Elevated A1c at 7.2% indicating poor glycemic control. Discussed the benefits of starting Jardiance (weight loss, blood pressure control, heart protection).  I have sent in a prescription and also given him samples but he will follow-up with his PCP for further management and prescription refills as well. -Start Jardiance 10mg  daily. -Obtain prescription from PCP, Dr. Ashley Royalty.  Hypertension Blood pressure readings consistently above goal (130/80) despite lifestyle modifications. Hopefully by addition of Jardiance, blood pressure will be improved goal blood pressure 130/80 mmHg or less preferably 75 mmHg  range.  Hyperlipidemia Well controlled with current medication. -Continue current cholesterol medication.  Fatty Liver Mildly elevated liver enzymes likely secondary to fatty liver. Discussed the importance of dietary modifications and weight loss. -Reduce fat intake and continue weight loss efforts.  General Health Maintenance -Continue daily walking, aim for at least 10,000 steps. -Add uphill walking or stair climbing to improve conditioning. -Avoid excessive alcohol intake, limit to 60ml three days a week. -Continue Vitamin D supplementation. -Follow up with PCP, Dr. Ashley Royalty, as needed.   Patient was also recommended CT angiogram of the carotid arteries however his physical examination is completely normal, he is asymptomatic, do not think there is any indication for CT angiogram I will see him back on a as needed basis.  I reviewed his labs, his external echocardiogram.  Signed,  Yates Decamp, MD, [facility] 01/12/2023, 10:26 AM

## 2023-01-12 NOTE — Addendum Note (Signed)
Addended by: Franchot Gallo on: 01/12/2023 05:54 PM   Modules accepted: Orders

## 2023-02-08 DIAGNOSIS — E559 Vitamin D deficiency, unspecified: Secondary | ICD-10-CM | POA: Diagnosis not present

## 2023-02-08 DIAGNOSIS — E1165 Type 2 diabetes mellitus with hyperglycemia: Secondary | ICD-10-CM | POA: Diagnosis not present

## 2023-02-08 DIAGNOSIS — I10 Essential (primary) hypertension: Secondary | ICD-10-CM | POA: Diagnosis not present

## 2023-02-08 DIAGNOSIS — E038 Other specified hypothyroidism: Secondary | ICD-10-CM | POA: Diagnosis not present

## 2023-02-08 DIAGNOSIS — I700 Atherosclerosis of aorta: Secondary | ICD-10-CM | POA: Diagnosis not present

## 2023-02-08 DIAGNOSIS — E782 Mixed hyperlipidemia: Secondary | ICD-10-CM | POA: Diagnosis not present

## 2023-02-08 DIAGNOSIS — D518 Other vitamin B12 deficiency anemias: Secondary | ICD-10-CM | POA: Diagnosis not present

## 2023-03-01 DIAGNOSIS — E782 Mixed hyperlipidemia: Secondary | ICD-10-CM | POA: Diagnosis not present

## 2023-03-01 DIAGNOSIS — E1165 Type 2 diabetes mellitus with hyperglycemia: Secondary | ICD-10-CM | POA: Diagnosis not present

## 2023-03-01 DIAGNOSIS — D519 Vitamin B12 deficiency anemia, unspecified: Secondary | ICD-10-CM | POA: Diagnosis not present

## 2023-03-01 DIAGNOSIS — I10 Essential (primary) hypertension: Secondary | ICD-10-CM | POA: Diagnosis not present

## 2023-03-01 DIAGNOSIS — Z79899 Other long term (current) drug therapy: Secondary | ICD-10-CM | POA: Diagnosis not present

## 2023-03-01 DIAGNOSIS — E559 Vitamin D deficiency, unspecified: Secondary | ICD-10-CM | POA: Diagnosis not present

## 2023-03-01 DIAGNOSIS — E038 Other specified hypothyroidism: Secondary | ICD-10-CM | POA: Diagnosis not present

## 2023-03-09 DIAGNOSIS — E559 Vitamin D deficiency, unspecified: Secondary | ICD-10-CM | POA: Diagnosis not present

## 2023-03-09 DIAGNOSIS — E782 Mixed hyperlipidemia: Secondary | ICD-10-CM | POA: Diagnosis not present

## 2023-03-09 DIAGNOSIS — E1165 Type 2 diabetes mellitus with hyperglycemia: Secondary | ICD-10-CM | POA: Diagnosis not present

## 2023-03-09 DIAGNOSIS — D518 Other vitamin B12 deficiency anemias: Secondary | ICD-10-CM | POA: Diagnosis not present

## 2023-03-09 DIAGNOSIS — E038 Other specified hypothyroidism: Secondary | ICD-10-CM | POA: Diagnosis not present

## 2023-03-09 DIAGNOSIS — I10 Essential (primary) hypertension: Secondary | ICD-10-CM | POA: Diagnosis not present

## 2023-03-09 DIAGNOSIS — I700 Atherosclerosis of aorta: Secondary | ICD-10-CM | POA: Diagnosis not present

## 2023-03-14 DIAGNOSIS — I10 Essential (primary) hypertension: Secondary | ICD-10-CM | POA: Diagnosis not present

## 2023-03-14 DIAGNOSIS — E782 Mixed hyperlipidemia: Secondary | ICD-10-CM | POA: Diagnosis not present

## 2023-03-14 DIAGNOSIS — E559 Vitamin D deficiency, unspecified: Secondary | ICD-10-CM | POA: Diagnosis not present

## 2023-03-14 DIAGNOSIS — E038 Other specified hypothyroidism: Secondary | ICD-10-CM | POA: Diagnosis not present

## 2023-03-14 DIAGNOSIS — E1165 Type 2 diabetes mellitus with hyperglycemia: Secondary | ICD-10-CM | POA: Diagnosis not present

## 2023-03-14 DIAGNOSIS — D518 Other vitamin B12 deficiency anemias: Secondary | ICD-10-CM | POA: Diagnosis not present

## 2023-03-14 DIAGNOSIS — I700 Atherosclerosis of aorta: Secondary | ICD-10-CM | POA: Diagnosis not present

## 2023-03-15 DIAGNOSIS — K581 Irritable bowel syndrome with constipation: Secondary | ICD-10-CM | POA: Diagnosis not present

## 2023-03-15 DIAGNOSIS — I10 Essential (primary) hypertension: Secondary | ICD-10-CM | POA: Diagnosis not present

## 2023-03-15 DIAGNOSIS — E782 Mixed hyperlipidemia: Secondary | ICD-10-CM | POA: Diagnosis not present

## 2023-05-05 DIAGNOSIS — E559 Vitamin D deficiency, unspecified: Secondary | ICD-10-CM | POA: Diagnosis not present

## 2023-05-05 DIAGNOSIS — D519 Vitamin B12 deficiency anemia, unspecified: Secondary | ICD-10-CM | POA: Diagnosis not present

## 2023-05-05 DIAGNOSIS — E782 Mixed hyperlipidemia: Secondary | ICD-10-CM | POA: Diagnosis not present

## 2023-05-05 DIAGNOSIS — E1165 Type 2 diabetes mellitus with hyperglycemia: Secondary | ICD-10-CM | POA: Diagnosis not present

## 2023-05-05 DIAGNOSIS — E038 Other specified hypothyroidism: Secondary | ICD-10-CM | POA: Diagnosis not present

## 2023-05-05 DIAGNOSIS — I700 Atherosclerosis of aorta: Secondary | ICD-10-CM | POA: Diagnosis not present

## 2023-05-05 DIAGNOSIS — I10 Essential (primary) hypertension: Secondary | ICD-10-CM | POA: Diagnosis not present

## 2023-05-05 DIAGNOSIS — D518 Other vitamin B12 deficiency anemias: Secondary | ICD-10-CM | POA: Diagnosis not present

## 2023-06-08 DIAGNOSIS — I700 Atherosclerosis of aorta: Secondary | ICD-10-CM | POA: Diagnosis not present

## 2023-06-08 DIAGNOSIS — E559 Vitamin D deficiency, unspecified: Secondary | ICD-10-CM | POA: Diagnosis not present

## 2023-06-08 DIAGNOSIS — E1165 Type 2 diabetes mellitus with hyperglycemia: Secondary | ICD-10-CM | POA: Diagnosis not present

## 2023-06-08 DIAGNOSIS — D519 Vitamin B12 deficiency anemia, unspecified: Secondary | ICD-10-CM | POA: Diagnosis not present

## 2023-06-08 DIAGNOSIS — E038 Other specified hypothyroidism: Secondary | ICD-10-CM | POA: Diagnosis not present

## 2023-06-08 DIAGNOSIS — E782 Mixed hyperlipidemia: Secondary | ICD-10-CM | POA: Diagnosis not present

## 2023-06-08 DIAGNOSIS — I10 Essential (primary) hypertension: Secondary | ICD-10-CM | POA: Diagnosis not present

## 2023-07-03 DIAGNOSIS — I700 Atherosclerosis of aorta: Secondary | ICD-10-CM | POA: Diagnosis not present

## 2023-07-03 DIAGNOSIS — I10 Essential (primary) hypertension: Secondary | ICD-10-CM | POA: Diagnosis not present

## 2023-07-03 DIAGNOSIS — E1165 Type 2 diabetes mellitus with hyperglycemia: Secondary | ICD-10-CM | POA: Diagnosis not present

## 2023-07-03 DIAGNOSIS — E038 Other specified hypothyroidism: Secondary | ICD-10-CM | POA: Diagnosis not present

## 2023-07-03 DIAGNOSIS — D519 Vitamin B12 deficiency anemia, unspecified: Secondary | ICD-10-CM | POA: Diagnosis not present

## 2023-07-03 DIAGNOSIS — E559 Vitamin D deficiency, unspecified: Secondary | ICD-10-CM | POA: Diagnosis not present

## 2023-07-03 DIAGNOSIS — E782 Mixed hyperlipidemia: Secondary | ICD-10-CM | POA: Diagnosis not present

## 2023-07-19 DIAGNOSIS — J019 Acute sinusitis, unspecified: Secondary | ICD-10-CM | POA: Diagnosis not present

## 2023-07-19 DIAGNOSIS — J310 Chronic rhinitis: Secondary | ICD-10-CM | POA: Diagnosis not present

## 2023-08-02 DIAGNOSIS — H1045 Other chronic allergic conjunctivitis: Secondary | ICD-10-CM | POA: Diagnosis not present

## 2023-08-02 DIAGNOSIS — J4540 Moderate persistent asthma, uncomplicated: Secondary | ICD-10-CM | POA: Diagnosis not present

## 2023-08-02 DIAGNOSIS — J301 Allergic rhinitis due to pollen: Secondary | ICD-10-CM | POA: Diagnosis not present

## 2023-08-02 DIAGNOSIS — J3089 Other allergic rhinitis: Secondary | ICD-10-CM | POA: Diagnosis not present

## 2023-08-03 DIAGNOSIS — E559 Vitamin D deficiency, unspecified: Secondary | ICD-10-CM | POA: Diagnosis not present

## 2023-08-03 DIAGNOSIS — E1165 Type 2 diabetes mellitus with hyperglycemia: Secondary | ICD-10-CM | POA: Diagnosis not present

## 2023-08-03 DIAGNOSIS — I10 Essential (primary) hypertension: Secondary | ICD-10-CM | POA: Diagnosis not present

## 2023-08-03 DIAGNOSIS — D519 Vitamin B12 deficiency anemia, unspecified: Secondary | ICD-10-CM | POA: Diagnosis not present

## 2023-08-03 DIAGNOSIS — E038 Other specified hypothyroidism: Secondary | ICD-10-CM | POA: Diagnosis not present

## 2023-08-03 DIAGNOSIS — E782 Mixed hyperlipidemia: Secondary | ICD-10-CM | POA: Diagnosis not present

## 2023-08-03 DIAGNOSIS — I700 Atherosclerosis of aorta: Secondary | ICD-10-CM | POA: Diagnosis not present

## 2023-08-21 DIAGNOSIS — E1165 Type 2 diabetes mellitus with hyperglycemia: Secondary | ICD-10-CM | POA: Diagnosis not present

## 2023-08-21 DIAGNOSIS — D519 Vitamin B12 deficiency anemia, unspecified: Secondary | ICD-10-CM | POA: Diagnosis not present

## 2023-08-21 DIAGNOSIS — Z0000 Encounter for general adult medical examination without abnormal findings: Secondary | ICD-10-CM | POA: Diagnosis not present

## 2023-08-21 DIAGNOSIS — E079 Disorder of thyroid, unspecified: Secondary | ICD-10-CM | POA: Diagnosis not present

## 2023-08-21 DIAGNOSIS — Z79899 Other long term (current) drug therapy: Secondary | ICD-10-CM | POA: Diagnosis not present

## 2023-08-21 DIAGNOSIS — E782 Mixed hyperlipidemia: Secondary | ICD-10-CM | POA: Diagnosis not present

## 2023-08-21 DIAGNOSIS — I10 Essential (primary) hypertension: Secondary | ICD-10-CM | POA: Diagnosis not present

## 2023-08-21 DIAGNOSIS — K581 Irritable bowel syndrome with constipation: Secondary | ICD-10-CM | POA: Diagnosis not present

## 2023-08-21 DIAGNOSIS — J45991 Cough variant asthma: Secondary | ICD-10-CM | POA: Diagnosis not present

## 2023-08-22 DIAGNOSIS — E1165 Type 2 diabetes mellitus with hyperglycemia: Secondary | ICD-10-CM | POA: Diagnosis not present

## 2023-08-22 DIAGNOSIS — J45991 Cough variant asthma: Secondary | ICD-10-CM | POA: Diagnosis not present

## 2023-08-22 DIAGNOSIS — E782 Mixed hyperlipidemia: Secondary | ICD-10-CM | POA: Diagnosis not present

## 2023-08-22 DIAGNOSIS — I700 Atherosclerosis of aorta: Secondary | ICD-10-CM | POA: Diagnosis not present

## 2023-08-22 DIAGNOSIS — D519 Vitamin B12 deficiency anemia, unspecified: Secondary | ICD-10-CM | POA: Diagnosis not present

## 2023-08-22 DIAGNOSIS — E038 Other specified hypothyroidism: Secondary | ICD-10-CM | POA: Diagnosis not present

## 2023-08-22 DIAGNOSIS — I10 Essential (primary) hypertension: Secondary | ICD-10-CM | POA: Diagnosis not present

## 2023-08-22 DIAGNOSIS — E559 Vitamin D deficiency, unspecified: Secondary | ICD-10-CM | POA: Diagnosis not present

## 2023-09-22 DIAGNOSIS — D519 Vitamin B12 deficiency anemia, unspecified: Secondary | ICD-10-CM | POA: Diagnosis not present

## 2023-09-22 DIAGNOSIS — E038 Other specified hypothyroidism: Secondary | ICD-10-CM | POA: Diagnosis not present

## 2023-09-22 DIAGNOSIS — J45991 Cough variant asthma: Secondary | ICD-10-CM | POA: Diagnosis not present

## 2023-09-22 DIAGNOSIS — E1165 Type 2 diabetes mellitus with hyperglycemia: Secondary | ICD-10-CM | POA: Diagnosis not present

## 2023-09-22 DIAGNOSIS — I700 Atherosclerosis of aorta: Secondary | ICD-10-CM | POA: Diagnosis not present

## 2023-09-22 DIAGNOSIS — I10 Essential (primary) hypertension: Secondary | ICD-10-CM | POA: Diagnosis not present

## 2023-09-22 DIAGNOSIS — E782 Mixed hyperlipidemia: Secondary | ICD-10-CM | POA: Diagnosis not present

## 2023-09-22 DIAGNOSIS — E559 Vitamin D deficiency, unspecified: Secondary | ICD-10-CM | POA: Diagnosis not present
